# Patient Record
Sex: Male | Born: 1935 | Race: Black or African American | Hispanic: No | Marital: Married | Smoking: Former smoker
Health system: Southern US, Community
[De-identification: ages and names within clinical notes are randomized; demographics above are authoritative.]

## PROBLEM LIST (undated history)

## (undated) DIAGNOSIS — I10 Essential (primary) hypertension: Secondary | ICD-10-CM

## (undated) DIAGNOSIS — N400 Enlarged prostate without lower urinary tract symptoms: Secondary | ICD-10-CM

## (undated) DIAGNOSIS — E785 Hyperlipidemia, unspecified: Secondary | ICD-10-CM

## (undated) DIAGNOSIS — K5790 Diverticulosis of intestine, part unspecified, without perforation or abscess without bleeding: Secondary | ICD-10-CM

## (undated) DIAGNOSIS — T783XXA Angioneurotic edema, initial encounter: Secondary | ICD-10-CM

## (undated) HISTORY — PX: JOINT REPLACEMENT: SHX530

## (undated) HISTORY — PX: HERNIA REPAIR: SHX51

---

## 2004-08-05 ENCOUNTER — Ambulatory Visit: Payer: Self-pay | Admitting: General Surgery

## 2009-02-14 ENCOUNTER — Ambulatory Visit: Payer: Self-pay | Admitting: Gastroenterology

## 2011-05-12 ENCOUNTER — Ambulatory Visit: Payer: Self-pay | Admitting: Orthopedic Surgery

## 2011-06-23 ENCOUNTER — Ambulatory Visit: Payer: Self-pay | Admitting: Orthopedic Surgery

## 2011-06-23 LAB — CBC
HGB: 13.3 g/dL (ref 13.0–18.0)
MCHC: 33.7 g/dL (ref 32.0–36.0)
Platelet: 179 10*3/uL (ref 150–440)
RDW: 14.7 % — ABNORMAL HIGH (ref 11.5–14.5)
WBC: 3.7 10*3/uL — ABNORMAL LOW (ref 3.8–10.6)

## 2011-06-23 LAB — PROTIME-INR: Prothrombin Time: 14.4 secs (ref 11.5–14.7)

## 2011-06-23 LAB — SEDIMENTATION RATE: Erythrocyte Sed Rate: 7 mm/hr (ref 0–20)

## 2011-07-08 ENCOUNTER — Inpatient Hospital Stay: Payer: Self-pay | Admitting: Orthopedic Surgery

## 2011-07-09 LAB — BASIC METABOLIC PANEL
Calcium, Total: 7.5 mg/dL — ABNORMAL LOW (ref 8.5–10.1)
Creatinine: 1.07 mg/dL (ref 0.60–1.30)
EGFR (African American): >60
EGFR (Non-African Amer.): >60
Glucose: 107 mg/dL — ABNORMAL HIGH (ref 65–99)
Osmolality: 276 (ref 275–301)
Potassium: 3.7 mmol/L (ref 3.5–5.1)
Sodium: 138 mmol/L (ref 136–145)

## 2011-07-09 LAB — HEMOGLOBIN: HGB: 11.6 g/dL — ABNORMAL LOW (ref 13.0–18.0)

## 2011-07-09 LAB — PLATELET COUNT: Platelet: 165 10*3/uL (ref 150–440)

## 2011-07-14 LAB — PATHOLOGY REPORT

## 2011-11-12 ENCOUNTER — Ambulatory Visit: Payer: Self-pay | Admitting: Orthopedic Surgery

## 2011-11-12 DIAGNOSIS — I10 Essential (primary) hypertension: Secondary | ICD-10-CM

## 2011-11-12 LAB — PROTIME-INR
INR: 1.1
Prothrombin Time: 14.1 secs (ref 11.5–14.7)

## 2011-11-12 LAB — CBC
HGB: 13.3 g/dL (ref 13.0–18.0)
MCH: 30.5 pg (ref 26.0–34.0)
MCHC: 34.8 g/dL (ref 32.0–36.0)
Platelet: 224 10*3/uL (ref 150–440)
RBC: 4.38 10*6/uL — ABNORMAL LOW (ref 4.40–5.90)
RDW: 14.7 % — ABNORMAL HIGH (ref 11.5–14.5)
WBC: 2.8 10*3/uL — ABNORMAL LOW (ref 3.8–10.6)

## 2011-11-12 LAB — BASIC METABOLIC PANEL
Calcium, Total: 8.7 mg/dL (ref 8.5–10.1)
Chloride: 101 mmol/L (ref 98–107)
Co2: 32 mmol/L (ref 21–32)
Creatinine: 0.96 mg/dL (ref 0.60–1.30)
EGFR (African American): >60
EGFR (Non-African Amer.): >60
Potassium: 3.7 mmol/L (ref 3.5–5.1)
Sodium: 139 mmol/L (ref 136–145)

## 2011-11-12 LAB — APTT: Activated PTT: 30.6 secs (ref 23.6–35.9)

## 2011-11-12 LAB — SEDIMENTATION RATE: Erythrocyte Sed Rate: 8 mm/hr (ref 0–20)

## 2011-11-25 ENCOUNTER — Inpatient Hospital Stay: Payer: Self-pay | Admitting: Orthopedic Surgery

## 2011-11-26 LAB — BASIC METABOLIC PANEL
Anion Gap: 5 — ABNORMAL LOW (ref 7–16)
BUN: 13 mg/dL (ref 7–18)
Chloride: 104 mmol/L (ref 98–107)
Creatinine: 0.81 mg/dL (ref 0.60–1.30)
EGFR (Non-African Amer.): >60
Glucose: 91 mg/dL (ref 65–99)
Osmolality: 272 (ref 275–301)

## 2011-11-27 LAB — PATHOLOGY REPORT

## 2011-12-01 ENCOUNTER — Encounter: Payer: Self-pay | Admitting: Internal Medicine

## 2011-12-07 ENCOUNTER — Encounter: Payer: Self-pay | Admitting: Internal Medicine

## 2012-11-09 ENCOUNTER — Inpatient Hospital Stay: Payer: Self-pay | Admitting: Internal Medicine

## 2012-11-09 LAB — COMPREHENSIVE METABOLIC PANEL
Anion Gap: 3 — ABNORMAL LOW (ref 7–16)
BUN: 20 mg/dL — ABNORMAL HIGH (ref 7–18)
Bilirubin,Total: 0.4 mg/dL (ref 0.2–1.0)
Calcium, Total: 9 mg/dL (ref 8.5–10.1)
Chloride: 105 mmol/L (ref 98–107)
Co2: 30 mmol/L (ref 21–32)
Creatinine: 1.06 mg/dL (ref 0.60–1.30)
EGFR (African American): >60
Glucose: 138 mg/dL — ABNORMAL HIGH (ref 65–99)
Osmolality: 280 (ref 275–301)
Potassium: 3.4 mmol/L — ABNORMAL LOW (ref 3.5–5.1)
SGOT(AST): 26 U/L (ref 15–37)
SGPT (ALT): 21 U/L (ref 12–78)
Total Protein: 7.3 g/dL (ref 6.4–8.2)

## 2012-11-09 LAB — URINALYSIS, COMPLETE
Ketone: NEGATIVE
Protein: NEGATIVE
RBC,UR: 2 /HPF (ref 0–5)
Squamous Epithelial: 1
WBC UR: 3 /HPF (ref 0–5)

## 2012-11-09 LAB — LIPID PANEL
Cholesterol: 98 mg/dL (ref 0–200)
HDL Cholesterol: 29 mg/dL — ABNORMAL LOW (ref 40–60)
Ldl Cholesterol, Calc: 55 mg/dL (ref 0–100)
Triglycerides: 68 mg/dL (ref 0–200)
VLDL Cholesterol, Calc: 14 mg/dL (ref 5–40)

## 2012-11-09 LAB — CBC
HGB: 13.9 g/dL (ref 13.0–18.0)
MCHC: 35 g/dL (ref 32.0–36.0)
MCV: 88 fL (ref 80–100)
Platelet: 195 10*3/uL (ref 150–440)
RBC: 4.53 10*6/uL (ref 4.40–5.90)
WBC: 3.9 10*3/uL (ref 3.8–10.6)

## 2012-11-09 LAB — CK TOTAL AND CKMB (NOT AT ARMC)
CK, Total: 214 U/L (ref 35–232)
CK-MB: 4.7 ng/mL — ABNORMAL HIGH (ref 0.5–3.6)

## 2012-11-10 LAB — BASIC METABOLIC PANEL
Anion Gap: 3 — ABNORMAL LOW (ref 7–16)
Chloride: 104 mmol/L (ref 98–107)
Co2: 30 mmol/L (ref 21–32)
Creatinine: 0.91 mg/dL (ref 0.60–1.30)
EGFR (African American): >60
EGFR (Non-African Amer.): >60
Glucose: 102 mg/dL — ABNORMAL HIGH (ref 65–99)
Potassium: 3.7 mmol/L (ref 3.5–5.1)
Sodium: 137 mmol/L (ref 136–145)

## 2012-11-10 LAB — CBC WITH DIFFERENTIAL/PLATELET
Basophil #: 0 10*3/uL (ref 0.0–0.1)
HGB: 13.9 g/dL (ref 13.0–18.0)
Lymphocyte #: 0.7 10*3/uL — ABNORMAL LOW (ref 1.0–3.6)
Lymphocyte %: 14.4 %
MCH: 30.8 pg (ref 26.0–34.0)
MCHC: 35.3 g/dL (ref 32.0–36.0)
MCV: 87 fL (ref 80–100)
Monocyte #: 0.3 x10 3/mm (ref 0.2–1.0)
Neutrophil %: 78.4 %
Platelet: 194 10*3/uL (ref 150–440)
RDW: 14.4 % (ref 11.5–14.5)

## 2012-11-10 LAB — CK TOTAL AND CKMB (NOT AT ARMC)
CK, Total: 161 U/L (ref 35–232)
CK, Total: 146 U/L (ref 35–232)
CK-MB: 3.5 ng/mL (ref 0.5–3.6)
CK-MB: 3.3 ng/mL (ref 0.5–3.6)

## 2012-11-10 LAB — TROPONIN I: Troponin-I: <0.02 ng/mL

## 2013-02-21 ENCOUNTER — Observation Stay: Payer: Self-pay | Admitting: Student

## 2013-02-21 LAB — BASIC METABOLIC PANEL
ANION GAP: 5 — AB (ref 7–16)
BUN: 14 mg/dL (ref 7–18)
CO2: 29 mmol/L (ref 21–32)
Calcium, Total: 8.1 mg/dL — ABNORMAL LOW (ref 8.5–10.1)
Chloride: 103 mmol/L (ref 98–107)
Creatinine: 0.93 mg/dL (ref 0.60–1.30)
EGFR (African American): >60
EGFR (Non-African Amer.): >60
Glucose: 116 mg/dL — ABNORMAL HIGH (ref 65–99)
OSMOLALITY: 275 (ref 275–301)
POTASSIUM: 3.5 mmol/L (ref 3.5–5.1)
Sodium: 137 mmol/L (ref 136–145)

## 2013-02-21 LAB — APTT: Activated PTT: 25.5 secs (ref 23.6–35.9)

## 2013-02-21 LAB — CBC WITH DIFFERENTIAL/PLATELET
BASOS ABS: 0 10*3/uL (ref 0.0–0.1)
Basophil %: 0.8 %
Eosinophil #: 0.1 10*3/uL (ref 0.0–0.7)
Eosinophil %: 3.6 %
HCT: 40.1 % (ref 40.0–52.0)
HGB: 13.2 g/dL (ref 13.0–18.0)
Lymphocyte #: 1.3 10*3/uL (ref 1.0–3.6)
Lymphocyte %: 32.1 %
MCH: 29.4 pg (ref 26.0–34.0)
MCHC: 32.8 g/dL (ref 32.0–36.0)
MCV: 90 fL (ref 80–100)
MONO ABS: 0.3 x10 3/mm (ref 0.2–1.0)
MONOS PCT: 7.6 %
NEUTROS ABS: 2.3 10*3/uL (ref 1.4–6.5)
Neutrophil %: 55.9 %
Platelet: 210 10*3/uL (ref 150–440)
RBC: 4.47 10*6/uL (ref 4.40–5.90)
RDW: 14.6 % — ABNORMAL HIGH (ref 11.5–14.5)
WBC: 4.2 10*3/uL (ref 3.8–10.6)

## 2013-02-21 LAB — URINALYSIS, COMPLETE
Bilirubin,UR: NEGATIVE
Blood: NEGATIVE
Glucose,UR: NEGATIVE mg/dL (ref 0–75)
Ketone: NEGATIVE
NITRITE: NEGATIVE
PROTEIN: NEGATIVE
Ph: 5 (ref 4.5–8.0)
RBC,UR: 2 /HPF (ref 0–5)
SPECIFIC GRAVITY: 1.018 (ref 1.003–1.030)
Squamous Epithelial: 1

## 2013-02-21 LAB — TROPONIN I: Troponin-I: <0.02 ng/mL

## 2013-02-21 LAB — PROTIME-INR
INR: 1
PROTHROMBIN TIME: 13.4 s (ref 11.5–14.7)

## 2013-02-22 LAB — BASIC METABOLIC PANEL
Anion Gap: 4 — ABNORMAL LOW (ref 7–16)
BUN: 14 mg/dL (ref 7–18)
CALCIUM: 8.9 mg/dL (ref 8.5–10.1)
Chloride: 104 mmol/L (ref 98–107)
Co2: 28 mmol/L (ref 21–32)
Creatinine: 0.88 mg/dL (ref 0.60–1.30)
EGFR (African American): >60
GLUCOSE: 100 mg/dL — AB (ref 65–99)
OSMOLALITY: 273 (ref 275–301)
POTASSIUM: 3.5 mmol/L (ref 3.5–5.1)
Sodium: 136 mmol/L (ref 136–145)

## 2013-02-22 LAB — LIPID PANEL
CHOLESTEROL: 114 mg/dL (ref 0–200)
HDL: 29 mg/dL — AB (ref 40–60)
LDL CHOLESTEROL, CALC: 73 mg/dL (ref 0–100)
Triglycerides: 61 mg/dL (ref 0–200)
VLDL Cholesterol, Calc: 12 mg/dL (ref 5–40)

## 2013-02-22 LAB — CBC WITH DIFFERENTIAL/PLATELET
BASOS PCT: 0.5 %
Basophil #: 0 10*3/uL (ref 0.0–0.1)
EOS ABS: 0.1 10*3/uL (ref 0.0–0.7)
Eosinophil %: 2.4 %
HCT: 39.2 % — ABNORMAL LOW (ref 40.0–52.0)
HGB: 13.4 g/dL (ref 13.0–18.0)
LYMPHS ABS: 1 10*3/uL (ref 1.0–3.6)
Lymphocyte %: 18.2 %
MCH: 30.6 pg (ref 26.0–34.0)
MCHC: 34.2 g/dL (ref 32.0–36.0)
MCV: 89 fL (ref 80–100)
Monocyte #: 0.3 x10 3/mm (ref 0.2–1.0)
Monocyte %: 5.6 %
Neutrophil #: 4 10*3/uL (ref 1.4–6.5)
Neutrophil %: 73.3 %
Platelet: 204 10*3/uL (ref 150–440)
RBC: 4.39 10*6/uL — ABNORMAL LOW (ref 4.40–5.90)
RDW: 14.6 % — ABNORMAL HIGH (ref 11.5–14.5)
WBC: 5.5 10*3/uL (ref 3.8–10.6)

## 2013-02-22 LAB — TSH: Thyroid Stimulating Horm: 0.806 u[IU]/mL

## 2013-02-22 LAB — MAGNESIUM: Magnesium: 1.7 mg/dL — ABNORMAL LOW

## 2013-02-22 LAB — HEMOGLOBIN A1C: Hemoglobin A1C: 6 % (ref 4.2–6.3)

## 2013-02-23 LAB — URINE CULTURE

## 2013-08-22 IMAGING — CR DG KNEE 1-2V*R*
1 series · 2 of 2 positions shown · non-contrast
Comparison: none

REASON FOR EXAM: total right knee
COMMENTS:

PROCEDURE:     DXR - DXR KNEE RIGHT AP AND LATERAL  - November 25, 2011  [DATE]
RESULT:     Comparison: None.

[Series 1: ap · 0.17mm/px · 2 of 2 slices shown]
[im 1/2]
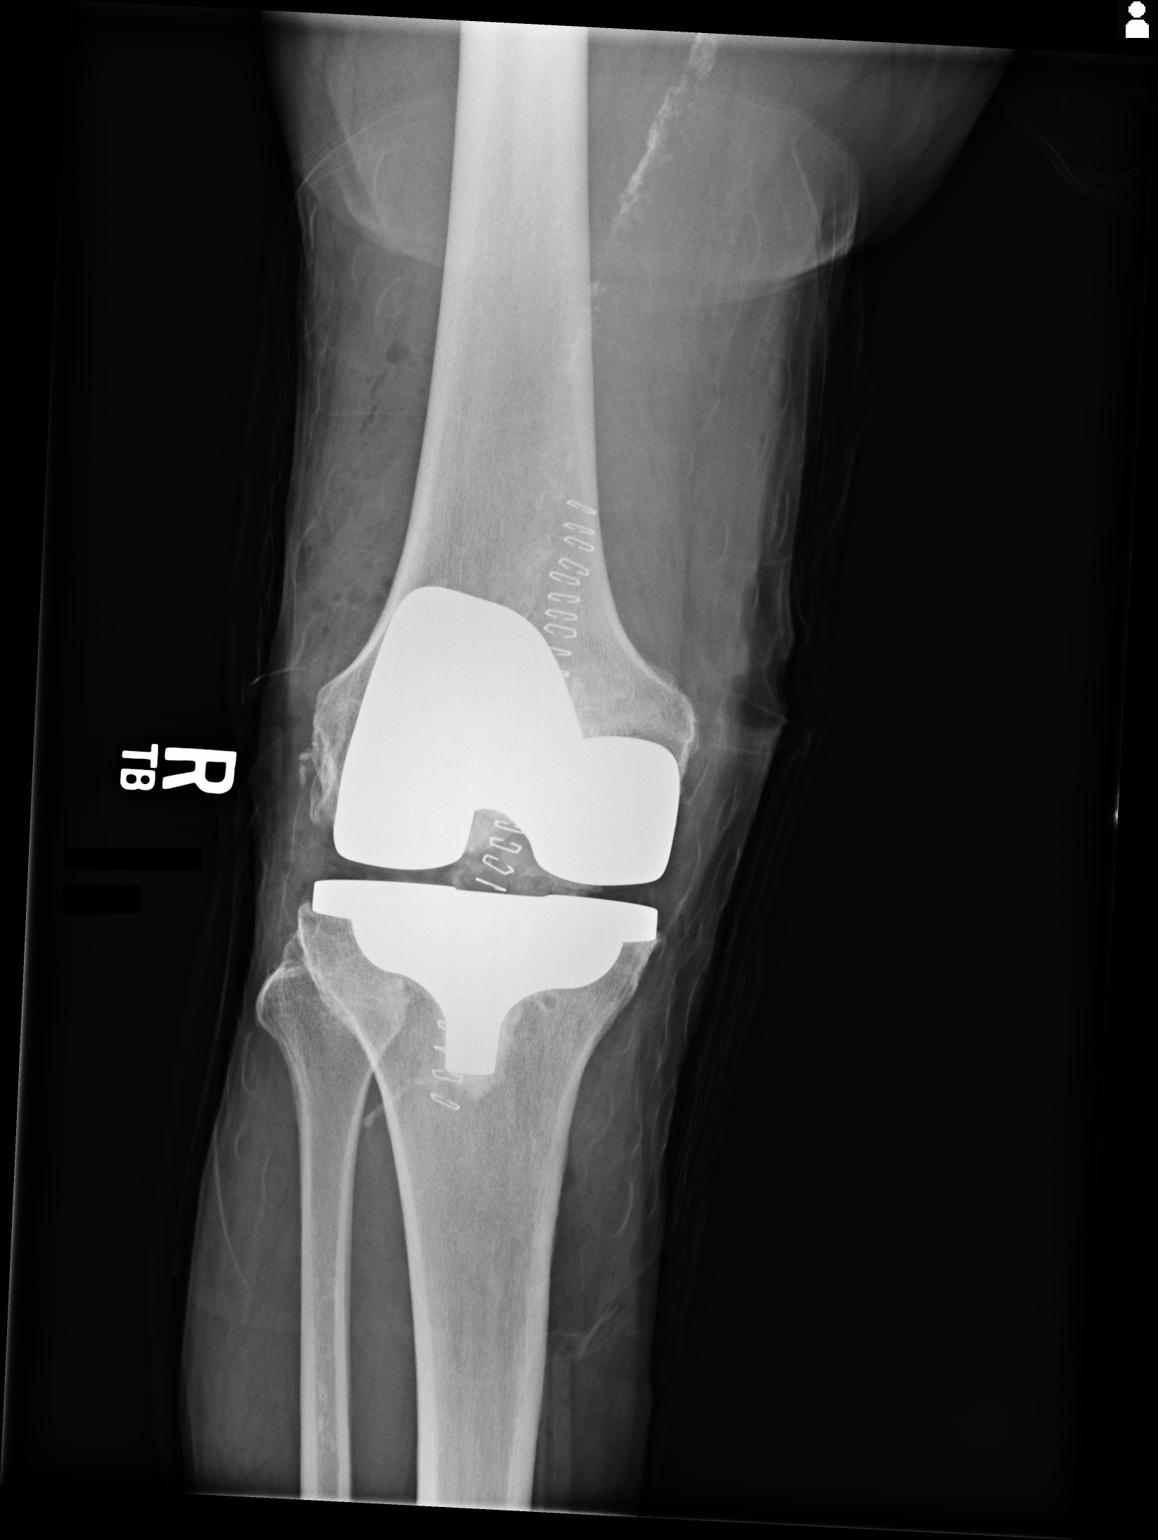
[im 2/2]
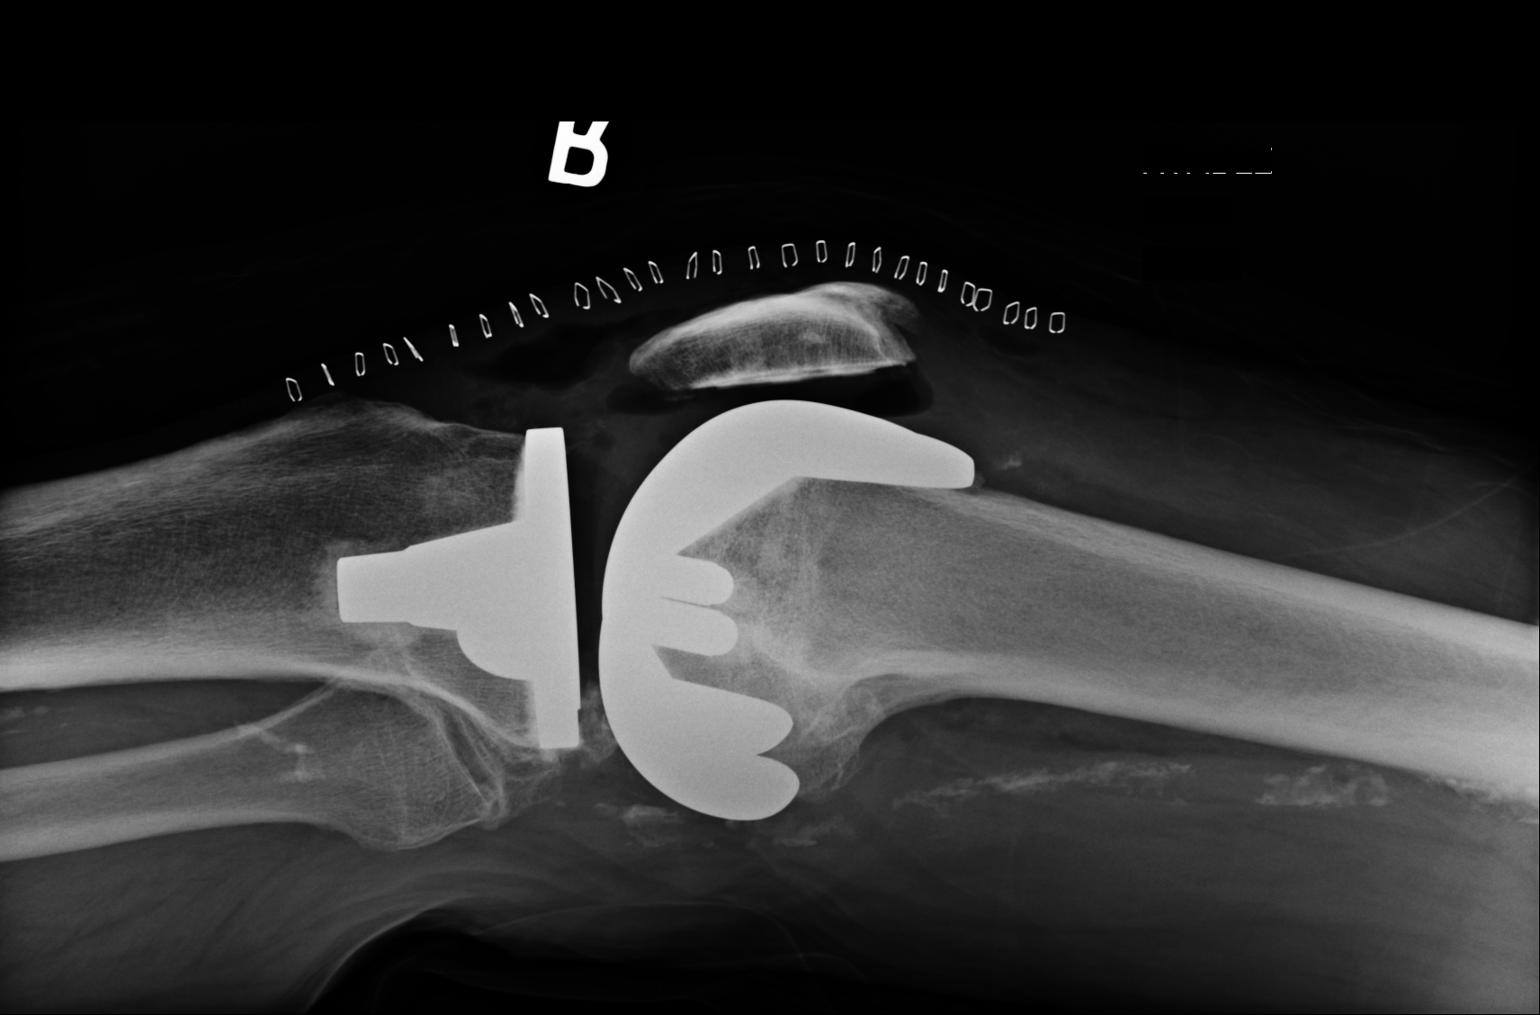

[2 of 2 positions shown; findings below may reference images not displayed]

FINDINGS: Hardware seen from right total knee arthroplasty. Skin staples are present.
Soft tissue gas is likely postoperative. Vascular calcifications are present.
IMPRESSION: Right total knee arthroplasty.

[REDACTED]

## 2014-02-01 DIAGNOSIS — Z800 Family history of malignant neoplasm of digestive organs: Secondary | ICD-10-CM | POA: Diagnosis not present

## 2014-02-17 ENCOUNTER — Ambulatory Visit: Payer: Self-pay | Admitting: Gastroenterology

## 2014-02-17 DIAGNOSIS — K5730 Diverticulosis of large intestine without perforation or abscess without bleeding: Secondary | ICD-10-CM | POA: Diagnosis not present

## 2014-02-17 DIAGNOSIS — I10 Essential (primary) hypertension: Secondary | ICD-10-CM | POA: Diagnosis not present

## 2014-02-17 DIAGNOSIS — Z1211 Encounter for screening for malignant neoplasm of colon: Secondary | ICD-10-CM | POA: Diagnosis not present

## 2014-02-17 DIAGNOSIS — Z800 Family history of malignant neoplasm of digestive organs: Secondary | ICD-10-CM | POA: Diagnosis not present

## 2014-03-20 DIAGNOSIS — I10 Essential (primary) hypertension: Secondary | ICD-10-CM | POA: Diagnosis not present

## 2014-03-20 DIAGNOSIS — E119 Type 2 diabetes mellitus without complications: Secondary | ICD-10-CM | POA: Diagnosis not present

## 2014-04-25 NOTE — Op Note (Signed)
PATIENT NAME:  Brett Johnston, Gerik J MR#:  [id number] DATE OF BIRTH:  [birth year]  DATE OF PROCEDURE:  11/25/2011  PREOPERATIVE DIAGNOSIS: Severe right knee osteoarthritis.   POSTOPERATIVE DIAGNOSIS: Severe right knee osteoarthritis.   PROCEDURE: Right total knee replacement.   SURGEON: Leitha SchullerMichael J. Waunita Sandstrom, MD   ASSISTANT: April Berndt, nurse practitioner  ANESTHESIA: Spinal.   DESCRIPTION OF PROCEDURE: The patient was brought to the operating room and after adequate anesthesia was obtained the right leg was prepped and draped in the usual sterile fashion with a tourniquet applied to the upper thigh. An Alvarado legholder was used during the procedure. After prepping and draping in the usual sterile manner, appropriate patient identification and time-out procedures were completed. The leg was exsanguinated with an Esmarch and the tourniquet raised to 300 mmHg at the start of the case. A midline skin incision was made followed by a medial parapatellar arthrotomy. Inspection of the knee revealed significant bone erosion on the medial compartment with extensive osteophyte formation. The patellofemoral joint also had extensive degenerative change with essentially no articular cartilage remaining. The lateral compartment had moderate degenerative change. The ACL and infrapatellar fat pad were excised. After exposing the proximal tibia, the proximal tibia cutting guide was placed and alignment checked. Proximal tibia cut was carried out and it matched the preop template. The medial and lateral menisci were excised at this time. The femoral cutting guide was then applied, drill holes made, and the distal cut made and again cuts matched the preop template. A #5 cutting block was applied and anterior, posterior, and chamfer cuts carried out. Trial components were placed at this time with a 5 tibia and a 10 mm insert. There is good stability in mid flexion and extension and at this point the distal femoral drill holes and  notch for the trochlear groove were made. The patella was cut with the guide and sized to a size 4. Drill holes were made for this. At this point the bony surfaces were thoroughly irrigated and dried with the posterior capsule infiltrated with 0.25% Sensorcaine with epinephrine and morphine in the posterior capsule. The bony surfaces were thoroughly irrigated and dried. The tibial component was cemented into place first followed by the 10 mm standard poly insert. Femoral component was then cemented into place and excess cement removed. The knee was held in extension as the patellar button was clamped into place again using cement, antibiotic cement being utilized. After the cement had set, patella was noted to track very well. Tourniquet was let down and hemostasis checked with electrocautery. The arthrotomy was repaired using a heavy quill suture, 2-0 quill subcutaneously, and skin staples. Xeroform, 4 x 4's, ABD, Webril, Polar Care, Ace wrap, and immobilizer were applied. The patient was sent to the recovery room in stable condition.   ESTIMATED BLOOD LOSS: 50 mL.   COMPLICATIONS: None.   SPECIMEN: Cut ends of bone.   TOURNIQUET TIME: 79 minutes at 300 mmHg.   ____________________________ Leitha SchullerMichael J. Taresa Montville, MD mjm:drc D: 11/25/2011 22:45:47 ET T: 11/26/2011 11:22:08 ET JOB#: [id number]  cc: Leitha SchullerMichael J. Esmeralda Malay, MD, <Dictator> Leitha SchullerMICHAEL J Arlee Santosuosso MD ELECTRONICALLY SIGNED 11/26/2011 12:37

## 2014-04-25 NOTE — Discharge Summary (Signed)
PATIENT NAME:  Brett Johnston, Brett Johnston MR#:  [id number] DATE OF BIRTH:  Sep 21, 1930  DATE OF ADMISSION:  11/25/2011 DATE OF DISCHARGE:  12/01/2011  ADMITTING DIAGNOSIS: Right knee osteoarthritis.   DISCHARGE DIAGNOSIS: Right knee osteoarthritis.   OPERATION: On 11/25/2011 he had a right total knee arthroplasty.   SURGEON: Dr. Kennedy Bucker   ANESTHESIA: Spinal.   ESTIMATED BLOOD LOSS: 50 mL.  TOURNIQUET TIME: 79 minutes at 300 mmHg.  DRAINS: None.  COMPLICATIONS: None.  IMPLANTS USED: Medacta 5 tibial, 5 standard femur, 10 mm insert, 4 patella, Medacta GMK total knee.    NOTE: Patient was stabilized and brought to the recovery room and then brought down to the orthopedic floor where he was treated by physical therapy and for pain control.   HISTORY: Brett Johnston is a 81 year old African American male underwent successful left total knee on 07/08/2011. After that surgery his right knee began bothering him and he had limitations to activities daily living secondary to pain. He had cortisone and Synvisc injections in the right knee without relief.   PHYSICAL EXAM: GENERAL: Well-developed, well-nourished, male in no apparent distress. Normal affect. Normal gait. Mildly antalgic component right lower extremity. HEENT: Head is normocephalic, atraumatic Pupils are equal, round, and reactive to light and accommodation. Has bilateral hearing aids and upper and lower dentures. Heart regular rate and rhythm. No murmur. Normal apical pulse. Lungs clear to auscultation. No wheeze, rhonchi or crackles. Normal expansion bilateral chest walls. Right lower extremity range of motion -10 to 120. Varus deformity. Crepitus with range of motion. Tender to palpation over medial joint line. No effusion. No swelling, warmth, erythema. Neurovascularly intact right lower extremity.   HOSPITAL COURSE: After admission 11/25/2011 he underwent a right total knee arthroplasty without complication. On postoperative day one his  hemoglobin was 11.8, and platelets 169. On postoperative day three he had a temperature of 100.0 and was not tolerating physical therapy very well. Decision was made to keep him for a couple more days. Original plan was for him to go home with home health but considering his slow progress with PT decision was made to transfer him to rehab. On postoperative day four, 11/29/2011, he had a bowel movement and temperature of 100.2. He continued to have minimal progress with physical therapy. On 11/30/2011, postoperative day number five, temperature is 100.2 and was beginning to tolerate physical therapy better. On postoperative day six he has poor tolerance to exercise. Decision was made to transfer him to rehab today as he was stable.   CONDITION AT DISCHARGE: Stable.   DISPOSITION: Patient sent to rehab.   DISCHARGE INSTRUCTIONS: Patient will follow up with [facility] orthopedics in two weeks. He may be weight bearing as tolerated on the right lower extremity and do physical therapy at rehab. His diet is regular. TED hose bilaterally. Covaderm on the right knee can be changed daily and as needed.   DISCHARGE MEDICATIONS:  1. Acetaminophen extra strength 500 to 1000 mg p.o. q.4 hours p.r.n. pain or temperature greater than 100.4.  2. Allopurinol 100 mg p.o. daily.  3. Aluminum hydroxide with simethicone 400/400/40/5 mL suspension 30 mL p.o. q.6h. p.r.n. indigestion, heartburn.  4. Amlodipine 5 mg 1 tab p.o. daily.  5. Dulcolax suppository 10 mg rectal daily p.r.n. constipation.  6. Docusate/senna 50/8.6 mg 1 tab p.o. b.i.d.  7. HCTZ 25 mg 1 tab p.o. q.a.m.  8. Magnesium hydroxide 30 mL p.o. b.i.d. p.r.n. constipation.  9. Oxycodone 5 mg to 10 mg p.o. q.4 hours  p.r.n. pain.  10. Potassium chloride 10 mEq p.o. daily.  11. Simvastatin 40 mg p.o. at bedtime.  12. Tamsulosin 0.4 mg p.o. at bedtime.  13. Tramadol 50 mg p.o. q.6 hours p.r.n. pain.  14. Zolpidem 5 mg p.o. at bedtime p.r.n. insomnia.    15. Xarelto 10 mg p.o. q.a.m. x7 days.   ____________________________ Brett Johnston M. Haskel KhanBerndt, NP amb:cms D: 12/01/2011 08:06:09 ET T: 12/01/2011 08:32:49 ET JOB#: [id number]  cc: Brett Anastacio M. Haskel KhanBerndt, NP, <Dictator> Burt EkAPRIL M Isack Lavalley FNP ELECTRONICALLY SIGNED 12/02/2011 15:46

## 2014-04-28 NOTE — H&P (Signed)
PATIENT NAME:  Brett Johnston, Brett Johnston Brett#:  [id number] DATE OF BIRTH:  1930/05/21  DATE OF ADMISSION:  11/09/2012  PRIMARY CARE PHYSICIAN: Hisham B. Danne Harbor, MD  REFERRING PHYSICIAN: Sheran Fava. Fanny Bien, MD   CHIEF COMPLAINT: Dizziness.   HISTORY OF PRESENT ILLNESS: Brett Johnston is an 81 year old pleasant African American male with a past medical history of hypertension, borderline diabetes mellitus, and was doing well in his usual state of health until this afternoon. The patient was going to the school to pick up his grandchildren. As he was returning back, started to experience severe dizziness. Concerned with this, he stopped the car and called his wife. Wife brought him to the Emergency Department. The episode lasted for about 10 minutes. He denies having any weakness in any part of the body. Workup in the Emergency Department, the patient was found to have an acute stroke in the left caudate nucleus, which is a lacunar infarct. Again, at the time of my interview the patient again had some episode of dizziness with vomiting. He did not have any weakness in any part of the body. Denies having any chest pain, palpitations; did experience some headache. Other lab work is unremarkable.   PAST MEDICAL HISTORY: 1. Hypertension.  2. Hyperlipidemia.  3. Gout.  4. Borderline diabetes mellitus.  5. Bilateral inguinal hernia repair.  6. Bilateral knee replacements.   ALLERGIES: No known drug allergies.   MEDICATIONS:   1. Tamsulosin 0.4 mg 1 capsule once a day.  2. Simvastatin 20 mg once a day.  3. Klor-Con 10 mEq daily.  4. Hydrochlorothiazide 25 mg once a day.  5. Amlodipine 5 mg daily.  6. Allopurinol 300 mg once a day.   SOCIAL HISTORY: No history of smoking, drinking alcohol or using illicit drugs. Married, lives with his wife.   FAMILY HISTORY: Mother had diabetes mellitus, died of unknown cause. Both parents died in their 5s.   REVIEW OF SYSTEMS: CONSTITUTIONAL: Experiences generalized weakness;  otherwise, usually well active.   EYES:  No change in vision.  EARS, NOSE, THROAT: Uses hearing aid. No sore throat.  RESPIRATORY: No cough, shortness of breath.  CARDIOVASCULAR: No chest pain, palpitations.  GASTROINTESTINAL: Had an episode of vomiting. Denies having any abdominal pain.  GENITOURINARY: No dysuria or hematuria.   ENDOCRINE: No polyuria or polydipsia.  HEMATOLOGIC: No easy bruising or bleeding.  SKIN: No rash or lesions.  MUSCULOSKELETAL: No joint pains and aches.  History of osteoarthritis.  NEUROLOGIC: No weakness or numbness in any part of the body, experiences dizziness.    PHYSICAL EXAMINATION: GENERAL: This is a well-built, well-nourished, age-appropriate male, lying down in the bed, not in distress.  VITAL SIGNS: Temperature 98, pulse 62, blood pressure 137/76, respiratory rate of 18, oxygen saturation 99% on room air.  HEENT: Head, normocephalic, atraumatic. No scleral icterus. Conjunctivae normal. Pupils equal and react to light. Extraocular movements are intact. Mucous membranes moist. No pharyngeal erythema.  NECK: Supple. No lymphadenopathy. No JVD. No carotid bruit. No thyromegaly.  CHEST: No focal tenderness.  LUNGS: Bilaterally clear to auscultation.  HEART: S1, S2 regular. No murmurs are heard.  ABDOMEN: Bowel sounds present. Soft, nontender, nondistended. No hepatosplenomegaly.  EXTREMITIES: No pedal edema. Pulses 2+.  NEUROLOGIC: The patient is alert; oriented to place, person and time. Cranial nerves II through XII intact. Motor 5/5 in upper and lower extremities.   LABORATORY, DIAGNOSTIC AND RADIOLOGIC DATA: Urinalysis negative for nitrites and leukocyte esterase. Complete metabolic panel: Potassium 3.4. CBC: WBC of 3.9,  hemoglobin 13.9, platelet count of 195.   CT head without contrast showed area of decreased attenuation, suspicious for small acute infarct in the caudate nucleus on the left.    ASSESSMENT AND PLAN: Brett Johnston is an 81 year old male  who comes to the Emergency Department with dizziness and is found to have acute infarct in the left caudate area.  1. Cerebrovascular accident. Admit the patient to a monitored bed. Cycle cardiac enzymes x 3. We will obtain MRI of the brain. The patient is given aspirin 325 mg in the Emergency Department. Continue with statin. We will involve physical therapy in the morning. We will  also obtain echocardiogram and carotid Doppler as part of the stroke workup. The patient has no history of arrhythmias.   2. Hypertension, currently well controlled. Continue the amlodipine. Hold the hydrochlorothiazide for now.  3. Diabetes mellitus, borderline, is well controlled. We will keep the patient on Accu-Cheks.  4. Keep the patient on deep vein thrombosis prophylaxis with Lovenox.    TIME SPENT: 45 minutes   ____________________________ Susa GriffinsPadmaja Deklen Popelka, MD pv:cc D: 11/09/2012 21:10:54 ET T: 11/09/2012 21:52:41 ET JOB#: [id number]  cc: Susa GriffinsPadmaja Nasteho Glantz, MD, <Dictator> Brett B. Danne HarborWalker III, MD Clerance LavPADMAJA Acheron Sugg MD ELECTRONICALLY SIGNED 12/05/2012 0:29

## 2014-04-29 NOTE — H&P (Signed)
PATIENT NAME:  Brett Johnston, Brett Johnston MR#:  [id number] DATE OF BIRTH:  1930/04/01  DATE OF ADMISSION:  02/21/2013  REFERRING PHYSICIAN: Dr. Fanny BienQuale.   PRIMARY CARE PHYSICIAN[facility]   CHIEF COMPLAINT: Dizziness.  HISTORY OF PRESENT ILLNESS: The patient is a pleasant 81 year old African American male with a history of hypertension, gout, hyperlipidemia, borderline diabetes, who comes in for dizziness. The patient of note had a very similar presentation in November, after which he was admitted. At that time, he was doing exactly the same, where he was going to pick up his grandkids and had severe dizziness, was admitted to the hospital and had an MRI done, which did not show acute stroke, and was discharged after couple of days. This time around, he again states that he had gone to pick up his grandkids, and this was about 11:30, and developed sudden onset dizziness. This was more positional as he was moving his head around. He came into the hospital, and ER physician discussed the case with Dr. Sherryll BurgerShah, who recommended observation for evaluation for stroke. The dizziness is better. He also did have a bout of vomiting, and he states that a lot and afterwards he did vomit after he was dizzy. He has no weakness, numbness, and his dizziness has improved.   PAST MEDICAL HISTORY: Hypertension, hyperlipidemia, gout, borderline diabetes, bilateral inguinal hernia repair, bilateral knee replacement.   ALLERGIES: None.   SOCIAL HISTORY: No tobacco, alcohol, or drug use.   FAMILY HISTORY: Mom with diabetes.   OUTPATIENT MEDICATIONS: Aspirin 325 mg 1 tab daily, amlodipine 5 mg daily, allopurinol 300 mg daily, hydrochlorothiazide 25 mg daily, Klor-Con 10 mEq once a day, simvastatin 20 mg daily, tamsulosin 0.4 mg daily.  REVIEW OF SYSTEMS:    CONSTITUTIONAL: No fevers, chills. Very little global weakness, no focal weakness, and a light headache.  EYES: No blurry vision or double vision.   EARS, NOSE, THROAT: No tinnitus or hearing loss. No sore throat. The patient does use bilateral hearing aids.  RESPIRATORY: No cough, wheezing, shortness of breath.  CARDIOVASCULAR: No chest pain, palpitations, CHF, or swelling in the legs.  GASTROINTESTINAL: Positive for vomiting x 1. No nausea now. No abdominal pain. No  bloody stools or dark stools.  GENITOURINARY: Denies dysuria or hematuria.  HEMATOLOGIC AND LYMPHATIC: No anemia or easy bruising.  SKIN: No rashes.  MUSCULOSKELETAL: Denies arthritis or gout.  NEUROLOGIC: No focal weakness or numbness.  PSYCHIATRIC: No anxiety or depression.   PHYSICAL EXAMINATION: VITAL SIGNS: Temperature on arrival 97.5, pulse rate 63, respiratory rate 18, blood pressure initially 146/75, O2 sat 95% on room air.  GENERAL: The patient is a well-developed male lying in bed in no obvious distress, talking in full sentences.  HEENT: Normocephalic, atraumatic. Pupils are equal and reactive. Extraocular muscles intact. Bilateral hearing aids. Moist mucous membranes.  NECK: Supple. No thyroid tenderness. No cervical lymphadenopathy.  CARDIOVASCULAR: S1, S2, regular rhythm. No murmurs, rubs, or gallops.  LUNGS: Clear to auscultation without wheezing, rhonchi, or rales.  ABDOMEN: Soft, nontender.  EXTREMITIES: No pitting edema.  SKIN: No obvious rashes or lesions.  NEUROLOGIC: Cranial nerves II through XII grossly intact. Strength is 5 out of 5 in all extremities. Sensation is intact to light touch. The patient has a negative finger-to-nose touch and heel-to-shin sign and rapid alternative movements. Did not ambulate the patient.  PSYCHIATRIC: Awake, alert, oriented x 3.   LABORATORY, DIAGNOSTIC, AND RADIOLOGIC DATA: EKG shows sinus brady with PACs, no acute  ST elevations or depressions. White count of 4.3, hemoglobin 13.2, platelets 210. Troponin negative. Glucose 116, BUN 14, creatinine 0.93, sodium 137, potassium 3.5. INR and PT are within normal limits.  Urinalysis not suggestive of infection. CT head without contrast showing stable noncontrast CT appearance of the brain, no acute intracranial abnormalities. X-ray chest, 1 view: A 3.1 x 2.4 cm opacity behind the right heart, which appears somewhat masslike. Lungs elsewhere are clear. Aorta is mildly prominent.   ASSESSMENT AND PLAN: We have a pleasant 81 year old with hypertension, hyperlipidemia, borderline diabetes, who comes in with another episode of dizziness, as he had in November, with a negative CAT scan of the head. At this point, he has mild dizziness but is neurologically intact, has a negative CT of the head. I would bring him in with this dizziness for further evaluation, including obtaining an MRI. He had an ultrasound of the carotids as well as echocardiogram a few months ago, which I would not repeat now, but would also obtain a neurology consult and also physical therapy consult. This could possibly also be benign positional vertigo, and I would start meclizine at this point. Would do frequent neuro checks and see what MRI shows. Would hold the blood pressure medication in case this is a stroke, but I doubt it. He does have this mass in the lung, and I would obtain a CT of the chest for further evaluation of this. We would check a lipid profile, TSH, and also a hemoglobin A1c. Start the patient on heparin for deep vein thrombosis prophylaxis, Zofran for vomiting.   TOTAL TIME SPENT: 40 minutes.    ____________________________ Krystal Eaton, MD sa:jcm D: 02/21/2013 16:16:32 ET T: 02/21/2013 16:53:32 ET JOB#: [id number]  cc: Krystal Eaton, MD, <Dictator> Bertran B. Danne Harbor, MD Marcelle [facility] MD ELECTRONICALLY SIGNED 03/11/2013 13:02

## 2014-04-30 NOTE — Op Note (Signed)
PATIENT NAME:  Brett Johnston, Brett Johnston MR#:  [id number] DATE OF BIRTH:  [birth year]  DATE OF PROCEDURE:  07/08/2011  PREOPERATIVE DIAGNOSIS: Severe left knee osteoarthritis.   POSTOPERATIVE DIAGNOSIS: Severe left knee osteoarthritis.   PROCEDURE PERFORMED:  Left total knee replacement.   SURGEON: Kennedy Bucker, MD    ASSISTANT: Cranston Neighbor, PA-C  ANESTHESIA: Spinal.    DESCRIPTION OF PROCEDURE: The patient was brought to the Operating Room, and after adequate spinal anesthesia was obtained there was initial complication of a urethral stricture, and there was a delay in the start of the case until Dr. Achilles Dunk could take care of this with dilation, and the catheter was to be left in place for one week postoperatively. After this had been completed, the left leg was prepped and draped in the usual sterile fashion with a tourniquet applied to the upper thigh. An Alvarado legholder was utilized.   After appropriate patient identification and timeout procedures were completed, the leg was exsanguinated with an Esmarch and the tourniquet raised to 300 mmHg. A midline skin incision was made followed by a medial parapatellar arthrotomy. There was extensive bone loss of the medial tibial compartment and exposed bone and sclerotic bone in the medial femoral condyle. The lateral compartment had pitting of the cartilage, and there was extensive wear with loss of all the articular cartilage on the patellar surface. The anterior cruciate ligament and fat pad were excised, and the proximal tibia was exposed with elevation of the medial capsule for placement of the Medacta cutting guide. When this was applied, the proximal tibial cut was carried out and the bone removed. Next, the femoral cut was made with an alignment guide applied after denuding some residual cartilage on the lateral femoral condyle. Distal drill holes were made as well and the distal femoral cut carried out and matched the bone block. The femur preoperatively  templated to a 5, and a 5 cutting block was applied. This gave appropriate cuts. Anterior, posterior, and chamfer cuts were carried out. At this point, the residual menisci were excised, and the posterior osteophyte off the medial femoral condyle was also removed with the use of a curved osteotome. After this was completed, the posterior cruciate ligament was released to allow for adequate mobilization of the tibia. Once this was done, the tibia could be brought forward and the tibial template applied, a #5 tibial baseplate. When this was in place, the pins were placed and the proximal drill hole made, followed by a V-cut with a 10 mm insert. With the #5 femur in place, the knee was brought out into extension and there was full range of motion. The patella tracked well at this point. The patella was cut with the cutting guide followed by additional freehand cut and drill holes made. It measured a 4 patella and was quite large. This also tracked well. At this point, the bony surfaces were thoroughly irrigated and dried, and local anesthetic as well as morphine and Toradol was infiltrated into the posterior capsule as well as the anterior capsule. The cement was mixed and the final components inserted and impacted with excess cement removed. After the cement had set, the knee was again thoroughly irrigated. There was good soft tissue balance, full extension and flexion with good tracking of the patella with no touch technique. The tourniquet was let down and hemostasis checked with electrocautery. The arthrotomy was repaired using a heavy quill for the capsule, 2-0 quill subcutaneously and skin staples. Xeroform, 4 x 4's, Webril,  and a Polar Care device were applied, along with a knee immobilizer; and the patient was sent to the recovery room in stable condition.   ESTIMATED BLOOD LOSS: 100 mL.   TOURNIQUET TIME: 59 minutes at 300 mmHg.   IMPLANTS: Medacta GMK Primary Total Knee System, left 5 standard femur, a  left fixed 5 tibia with a 10 mm UC insert, and a size 4 patella, all components cemented.   CONDITION TO RECOVERY ROOM: Stable.   SPECIMENS: Cut ends of bone.   ____________________________ Leitha SchullerMichael J. Gordon Carlson, MD mjm:cbb D: 07/08/2011 14:34:51 ET T: 07/08/2011 15:29:41 ET JOB#: [id number]  cc: Leitha SchullerMichael J. Amiyah Shryock, MD, <Dictator> Leitha SchullerMICHAEL J Aubrie Lucien MD ELECTRONICALLY SIGNED 07/08/2011 16:57

## 2014-04-30 NOTE — Consult Note (Signed)
Patient seen as an intraoperative consultation for catheter placement.  Assessment: Urethral stricture The patient underwent cystoscopy with urethral dilation and catheter placement.  There was a significant bulbar urethral stricture present.  He will need the Foley catheter for approximately one week.  [facility] with [facility].  Follow up can be arranged for catheter removal in the office.  He will need a subsequent cystoscopy in approximately 4 weeks for evaluation of stricture recurrence.  He may need more definitive treatment at some point in the near future.  Consider covering for more gram-negative bacteria with the manipulation.  A request was made for gentamicin 80 mg post procedure.  No other urological intervention is indicated at present.  Please contact us with any further questions.  Electronic Signatures: Smith Robertope, Maui Ahart S (MD)  (Signed on 02-Jul-13 12:08)  Authored  Last Updated: 02-Jul-13 12:08 by Smith Robertope, Lizandro Spellman S (MD)

## 2014-04-30 NOTE — Discharge Summary (Signed)
PATIENT NAME:  Brett Johnston, KIENER MR#:  [id number] DATE OF BIRTH:  1930-08-07  DATE OF ADMISSION:  07/08/2011 DATE OF DISCHARGE:  07/11/2011  ADMITTING DIAGNOSIS: Severe left knee osteoarthritis.   DISCHARGE DIAGNOSIS: Severe left knee osteoarthritis.  PROCEDURE PERFORMED: Left total knee replacement.   SURGEON: Kennedy Bucker, M.D.   ASSISTANT: Cranston Neighbor, PA-C.   ANESTHESIA: Spinal.   ESTIMATED BLOOD LOSS: 100 mL.  TOURNIQUET TIME: 59 minutes at 300 mmHg.  IMPLANTS: Medacta GMK primary total knee system, left five standard femur, a left fixed five tibia with a 10 mm, a UC insert and a size four patella. All components cemented.  CONDITION TO RECOVERY ROOM: Stable.   HISTORY: The patient is a 81 year old with two to three year history of severe left knee pain. The patient had pain in the medial and lateral compartments of the knee. He did not have any relief with anti-inflammatory medications as well as corticosteroid and Synvisc injections. He had been seen by Dr. Rosita Kea and had a MRI and knee CT which showed severe knee osteoarthritis in bilateral knees. The patient agreed to left knee first.   PHYSICAL EXAMINATION: GENERAL: Well developed, well nourished male in no apparent distress. Normal affect. Normal gait. No antalgic component.   HEENT: Head is normocephalic, atraumatic. Pupils equal, round, and reactive to light. Neck symmetric. The patient does have upper and lower dentures.   HEART: Examination of the heart reveals regular rate and rhythm. There is no murmur. There is normal apical pulse.   LUNGS: The lungs are clear to auscultation. There is no wheeze, rhonchi or crackles. There is normal expansion of bilateral chest walls.  LEFT LOWER EXTREMITY: Examination of left lower extremity shows no effusion. No erythema. No warmth. No bony abnormalities noted. There is a slight varus abnormality. The patient is tender along the medial and lateral joint lines. There is a small  posterior joint effusion. The patient has full extension and full flexion. There is tenderness and crepitus with flexion and extension. The patient has a negative McMurray test. There is no retropatellar discomfort. The patient has a negative talar stress test. There is no laxity of the knee.   NEUROLOGIC: There is good quad control. There is no known atrophy. The patient has a negative straight leg raise, and the patient has normal sensation to light touch.   VASCULAR: The patient has less than 2 second capillary refill. Dorsalis pedis and posterior tibial pulses are intact.   HOSPITAL COURSE: The patient was admitted to the hospital on 07/08/2011 for left total knee replacement. The patient had surgery that same day and was brought to the orthopedic floor from the PAC-U. The patient's Foley remained intact, and the patient was seen by Dr. Achilles Dunk for urethral stricture. The patient is to follow-up with Dr. Achilles Dunk in one week for Foley removal.  Throughout the patient's three-day stay, the patient progressed well with physical therapy. He had a bowel movement. Vital signs remained stable as well as hemoglobin.  On 07/11/2011, the patient was stable and ready for discharge to go home with home health and physical therapy.   CONDITION AT DISCHARGE: Stable.   DISCHARGE INSTRUCTIONS:  1. The patient may gradually increase weight-bearing on the affected extremity. 2. May elevate the affected foot/or leg on 1 or 2 pillows with the foot higher than the knee. 3. The knee-high TED hose on both legs and remove at bedtime. He needs to replace TED hose on the next morning. 4. The patient may  resume a regular diet. 5. He is to start Xarelto, pain meds oxycodone 5 to 10 mg every four hours as needed for pain. 6. He is to continue the Polar Care unit maintaining temperature between 40 and 50 degrees.  7. Keep the dressing clean and dry. 8. He is to call [facility] if the dressing gets water under  it. He should also [facility] if fever over 101.5, redness, swelling or drainage from the incision site. 9. He is referred with home physical therapy and home health.  FOLLOW-UP APPOINTMENT: Call [facility] and schedule appointment for two weeks.   DISCHARGE MEDICATIONS: 1. Amlodipine 5 mg oral tablet 1 tablet orally once a day in the morning. 2. Simvastatin 20 mg oral tablet 1 tablet orally once a day at bedtime. 3. Allopurinol 300 mg oral tablet 1 tablet orally once a day in the morning. 4. Tamsulosin 0.4 mg oral capsule one cap orally once a day in the morning. 5. Hydrochlorothiazide 25 mg oral tablet 1 tablet orally once a day in the morning. 6. Klor-Con 10 oral tablet, extended release 1 tablet orally once a day in the morning.     ____________________________ Evon Slackhomas C. Jini Horiuchi, PA-C tcg:kma D: 07/11/2011 07:57:08 ET T: 07/12/2011 14:32:45 ET JOB#: [id number]  cc: Evon Slackhomas C. Analaya Hoey, PA-C, <Dictator> Evon SlackHOMAS C Carolie Mcilrath GeorgiaPA ELECTRONICALLY SIGNED 07/17/2011 14:04

## 2014-04-30 NOTE — Op Note (Signed)
PATIENT NAME:  Brett Johnston, Brett Johnston MR#:  [id number] DATE OF BIRTH:  05-08-1930  DATE OF PROCEDURE:  07/08/2011  HISTORY: [name] is a 81 year old African American gentleman with no known prior urological history. He presented for a total knee replacement. Staff was unable to place a Foley catheter prior to the procedure. The patient was already under anesthesia at the time of consultation, so history-gathering was minimal. The decision was made to proceed with cystoscopy for further evaluation.   SURGEON: Edrick Oh, MD   DESCRIPTION OF PROCEDURE: The patient was then prepped and draped in the standard fashion. The 17-French flexible cystoscope was inserted through the urethra. Significant blood and clot was noted through the urethra in the bulbar region. A blind-ending area was encountered. No definitive opening was visible. A flexible tip Glidewire was utilized. Multiple manipulations were made with successful navigation through a very small opening. The guidewire was passed into the urinary bladder. Urethral dilators were then utilized to dilate sequentially from approximately 4-French to 18-French. Cystoscopy at 18-French demonstrated reasonable opening of the bulbar urethra with passage into the urinary bladder. The guidewire was repositioned through the cystoscope for confirmation of location. It appears that the strictured site was relatively thin and film-like. There was relative ease with dilation with only minimal resistance met with the first few dilations. An 18-French Councill catheter was then placed over the guidewire into the urinary bladder. Minimal resistance was met at the site of the stricture. The catheter was inflated and placed to gravity drainage. The patient was reprepped for the remainder of the procedure. He will need to keep the Foley catheter for approximately one week. He will need follow-up evaluation to evaluate for recurrence.  ____________________________ Denice Bors. Jacqlyn Larsen,  MD bsc:cbb D: 07/08/2011 11:53:50 ET T: 07/08/2011 13:11:11 ET JOB#: [id number]  cc: Denice Bors. Jacqlyn Larsen, MD, <Dictator> Laurene Footman, MD Denice Bors Jetty Berland MD ELECTRONICALLY SIGNED 07/08/2011 14:29

## 2014-06-23 DIAGNOSIS — I10 Essential (primary) hypertension: Secondary | ICD-10-CM | POA: Diagnosis not present

## 2014-06-23 DIAGNOSIS — E119 Type 2 diabetes mellitus without complications: Secondary | ICD-10-CM | POA: Diagnosis not present

## 2014-08-02 ENCOUNTER — Encounter: Payer: Self-pay | Admitting: Emergency Medicine

## 2014-08-02 ENCOUNTER — Emergency Department
Admission: EM | Admit: 2014-08-02 | Discharge: 2014-08-02 | Disposition: A | Discharge status: Home / Self Care | Payer: Commercial Managed Care - HMO | Attending: Emergency Medicine | Admitting: Emergency Medicine

## 2014-08-02 ENCOUNTER — Emergency Department: Payer: Commercial Managed Care - HMO

## 2014-08-02 DIAGNOSIS — Y9389 Activity, other specified: Secondary | ICD-10-CM | POA: Insufficient documentation

## 2014-08-02 DIAGNOSIS — Y9289 Other specified places as the place of occurrence of the external cause: Secondary | ICD-10-CM | POA: Insufficient documentation

## 2014-08-02 DIAGNOSIS — M19011 Primary osteoarthritis, right shoulder: Secondary | ICD-10-CM

## 2014-08-02 DIAGNOSIS — Y998 Other external cause status: Secondary | ICD-10-CM | POA: Diagnosis not present

## 2014-08-02 DIAGNOSIS — W1839XA Other fall on same level, initial encounter: Secondary | ICD-10-CM | POA: Diagnosis not present

## 2014-08-02 DIAGNOSIS — S4991XA Unspecified injury of right shoulder and upper arm, initial encounter: Secondary | ICD-10-CM | POA: Diagnosis not present

## 2014-08-02 DIAGNOSIS — M25511 Pain in right shoulder: Secondary | ICD-10-CM | POA: Diagnosis not present

## 2014-08-02 DIAGNOSIS — Z87891 Personal history of nicotine dependence: Secondary | ICD-10-CM | POA: Insufficient documentation

## 2014-08-02 DIAGNOSIS — S8992XA Unspecified injury of left lower leg, initial encounter: Secondary | ICD-10-CM | POA: Diagnosis present

## 2014-08-02 MED ORDER — MELOXICAM 7.5 MG PO TABS: 7.5000 mg | ORAL_TABLET | Freq: Every day | ORAL | Status: DC

## 2014-08-02 NOTE — ED Notes (Signed)
Pt states he slipped and fell Saturday night and landed on right shoulder, states he is now unable to lift shoulder above head

## 2014-08-02 NOTE — Discharge Instructions (Signed)
Arthritis, Nonspecific °Arthritis is pain, redness, warmth, or puffiness (inflammation) of a joint. The joint may be stiff or hurt when you move it. One or more joints may be affected. There are many types of arthritis. Your doctor may not know what type you have right away. The most common cause of arthritis is wear and tear on the joint (osteoarthritis). °HOME CARE  °· Only take medicine as told by your doctor. °· Rest the joint as much as possible. °· Raise (elevate) your joint if it is puffy. °· Use crutches if the painful joint is in your leg. °· Drink enough fluids to keep your pee (urine) clear or pale yellow. °· Follow your doctor's diet instructions. °· Use cold packs for very bad joint pain for 10 to 15 minutes every hour. Ask your doctor if it is okay for you to use hot packs. °· Exercise as told by your doctor. °· Take a warm shower if you have stiffness in the morning. °· Move your sore joints throughout the day. °GET HELP RIGHT AWAY IF:  °· You have a fever. °· You have very bad joint pain, puffiness, or redness. °· You have many joints that are painful and puffy. °· You are not getting better with treatment. °· You have very bad back pain or leg weakness. °· You cannot control when you poop (bowel movement) or pee (urinate). °· You do not feel better in 24 hours or are getting worse. °· You are having side effects from your medicine. °MAKE SURE YOU:  °· Understand these instructions. °· Will watch your condition. °· Will get help right away if you are not doing well or get worse. °Document Released: 03/19/2009 Document Revised: 06/24/2011 Document Reviewed: 03/19/2009 °Exit[employer]® Patient Information ©2015 [employer]. This information is not intended to replace advice given to you by your health care provider. Make sure you discuss any questions you have with your health care provider. ° °

## 2014-08-02 NOTE — ED Provider Notes (Signed)
[facility] Emergency Department Provider Note  ____________________________________________  Time seen: Approximately 5:35 PM  I have reviewed the triage vital signs and the nursing notes.   HISTORY  Chief Complaint Shoulder Pain    HPI Brett Johnston is a 81 y.o. male patient complaining of decreased range of motion especially over his left is secondary to a slip and fall landing on his right shoulder 5 days ago. Patient state this causing no pain elicited temp to abduct the arm or each over his head. Denies any loss of sensation or loss of strength of the right upper extremity. No palliative measures except for decreased his range of motion to avoid pain. Patient state there is a sharp pain when he tried to reach above his head.  History reviewed. No pertinent past medical history.  There are no active problems to display for this patient.   Past Surgical History  Procedure Laterality Date  . Joint replacement      Current Outpatient Rx  Name  Route  Sig  Dispense  Refill  . meloxicam (MOBIC) 7.5 MG tablet   Oral   Take 1 tablet (7.5 mg total) by mouth daily.   15 tablet   0     Allergies Review of patient's allergies indicates no known allergies.  No family history on file.  Social History History  Substance Use Topics  . Smoking status: Former Games developer  . Smokeless tobacco: Not on file  . Alcohol Use: No    Review of Systems Constitutional: No fever/chills Eyes: No visual changes. ENT: No sore throat. Cardiovascular: Denies chest pain. Respiratory: Denies shortness of breath. Gastrointestinal: No abdominal pain.  No nausea, no vomiting.  No diarrhea.  No constipation. Genitourinary: Negative for dysuria. Musculoskeletal: Decreased range of motion right upper extremity.  Skin: Negative for rash. Neurological: Negative for headaches, focal weakness or numbness. 10-point ROS otherwise  negative.  ____________________________________________   PHYSICAL EXAM:  VITAL SIGNS: ED Triage Vitals  Enc Vitals Group     BP 08/02/14 1700 113/74 mmHg     Pulse Rate 08/02/14 1700 65     Resp 08/02/14 1700 18     Temp 08/02/14 1700 98.1 F (36.7 C)     Temp Source 08/02/14 1700 Oral     SpO2 08/02/14 1700 96 %     Weight 08/02/14 1700 185 lb (83.915 kg)     Height 08/02/14 1700  (1.702 m)     Head Cir --      Peak Flow --      Pain Score 08/02/14 1701 0     Pain Loc --      Pain Edu? --      Excl. in GC? --     Constitutional: Alert and oriented. Well appearing and in no acute distress. Eyes: Conjunctivae are normal. PERRL. EOMI. Head: Atraumatic. Nose: No congestion/rhinnorhea. Mouth/Throat: Mucous membranes are moist.  Oropharynx non-erythematous. Neck: No stridor. No cervical spine tenderness to palpation. Hematological/Lymphatic/Immunilogical: No cervical lymphadenopathy. Cardiovascular: Normal rate, regular rhythm. Grossly normal heart sounds.  Good peripheral circulation. Respiratory: Normal respiratory effort.  No retractions. Lungs CTAB. Gastrointestinal: Soft and nontender. No distention. No abdominal bruits. No CVA tenderness. Musculoskeletal: No lower extremity tenderness nor edema.  No joint effusions. Neurologic:  Normal speech and language. No gross focal neurologic deficits are appreciated. No gait instability. Skin:  Skin is warm, dry and intact. No rash noted. Psychiatric: Mood and affect are normal. Speech and behavior are normal.  ____________________________________________   LABS (all labs ordered are listed, but only abnormal results are displayed)  Labs Reviewed - No data to display ____________________________________________  EKG   ____________________________________________  RADIOLOGY  No acute findings. Arthritis is noticed in the [facility] joint and also in the before meals joint.  I, Joni Reining, personally viewed and  evaluated these images as part of my medical decision making.   ____________________________________________   PROCEDURES  Procedure(s) performed: None  Critical Care performed: No  ____________________________________________   INITIAL IMPRESSION / ASSESSMENT AND PLAN / ED COURSE  Pertinent labs & imaging results that were available during my care of the patient were reviewed by me and considered in my medical decision making (see chart for details).  Impingement right shoulder secondary to arthritis. Patient advised on supportive home care and will be given a prescription for Motrin 7.5 mg by mouth daily for 10 days. Patient advised follow-up with family doctor if no improvement or return to ER if condition worsens. ____________________________________________   FINAL CLINICAL IMPRESSION(S) / ED DIAGNOSES  Final diagnoses:  Arthritis of right shoulder region       Joni Reining, PA-C 08/02/14 1827  Joni Reining, PA-C 08/02/14 1830  Phineas Semen, MD 08/02/14 [phone]

## 2014-08-02 NOTE — ED Notes (Signed)
Pt has right shoulder pain.  States he slipped on wet grass 5 days ago and landed on right shoulder.  Limited rom.  Denies pain.  Denies other injuries.  Pt alert.

## 2014-08-09 DIAGNOSIS — M25511 Pain in right shoulder: Secondary | ICD-10-CM | POA: Diagnosis not present

## 2014-09-13 ENCOUNTER — Encounter: Payer: Self-pay | Admitting: *Deleted

## 2014-09-13 DIAGNOSIS — Y9389 Activity, other specified: Secondary | ICD-10-CM | POA: Insufficient documentation

## 2014-09-13 DIAGNOSIS — S81012A Laceration without foreign body, left knee, initial encounter: Secondary | ICD-10-CM | POA: Insufficient documentation

## 2014-09-13 DIAGNOSIS — Y9289 Other specified places as the place of occurrence of the external cause: Secondary | ICD-10-CM | POA: Diagnosis not present

## 2014-09-13 DIAGNOSIS — Z23 Encounter for immunization: Secondary | ICD-10-CM | POA: Insufficient documentation

## 2014-09-13 DIAGNOSIS — W312XXA Contact with powered woodworking and forming machines, initial encounter: Secondary | ICD-10-CM | POA: Insufficient documentation

## 2014-09-13 DIAGNOSIS — Z791 Long term (current) use of non-steroidal anti-inflammatories (NSAID): Secondary | ICD-10-CM | POA: Insufficient documentation

## 2014-09-13 DIAGNOSIS — Z87891 Personal history of nicotine dependence: Secondary | ICD-10-CM | POA: Diagnosis not present

## 2014-09-13 DIAGNOSIS — Y998 Other external cause status: Secondary | ICD-10-CM | POA: Diagnosis not present

## 2014-09-13 DIAGNOSIS — S8992XA Unspecified injury of left lower leg, initial encounter: Secondary | ICD-10-CM | POA: Diagnosis present

## 2014-09-13 NOTE — ED Notes (Signed)
Pt has laceration to left knee.  Pt states cut self with a chainsaw tonight.  bleeding controlled

## 2014-09-14 ENCOUNTER — Emergency Department
Admission: EM | Admit: 2014-09-14 | Discharge: 2014-09-14 | Disposition: A | Discharge status: Home / Self Care | Payer: Commercial Managed Care - HMO | Attending: Emergency Medicine | Admitting: Emergency Medicine

## 2014-09-14 DIAGNOSIS — Z23 Encounter for immunization: Secondary | ICD-10-CM | POA: Diagnosis not present

## 2014-09-14 DIAGNOSIS — Z87891 Personal history of nicotine dependence: Secondary | ICD-10-CM | POA: Diagnosis not present

## 2014-09-14 DIAGNOSIS — Z791 Long term (current) use of non-steroidal anti-inflammatories (NSAID): Secondary | ICD-10-CM | POA: Diagnosis not present

## 2014-09-14 DIAGNOSIS — IMO0002 Reserved for concepts with insufficient information to code with codable children: Secondary | ICD-10-CM

## 2014-09-14 DIAGNOSIS — S81012A Laceration without foreign body, left knee, initial encounter: Secondary | ICD-10-CM | POA: Diagnosis not present

## 2014-09-14 MED ORDER — LIDOCAINE-EPINEPHRINE (PF) 2 %-1:200000 IJ SOLN
10.0000 mL | Freq: Once | INTRAMUSCULAR | Status: AC
  Administered 2014-09-14: 10 mL via INTRADERMAL

## 2014-09-14 MED ORDER — LIDOCAINE-EPINEPHRINE 1 %-1:100000 IJ SOLN: 10.0000 mL | Freq: Once | INTRAMUSCULAR | Status: DC

## 2014-09-14 MED ORDER — BACITRACIN ZINC 500 UNIT/GM EX OINT
TOPICAL_OINTMENT | Freq: Once | CUTANEOUS | Status: AC
  Administered 2014-09-14: 2 via TOPICAL
  Filled 2014-09-14: qty 1.8

## 2014-09-14 MED ORDER — TETANUS-DIPHTH-ACELL PERTUSSIS 5-2.5-18.5 LF-MCG/0.5 IM SUSP
0.5000 mL | Freq: Once | INTRAMUSCULAR | Status: AC
  Administered 2014-09-14: 0.5 mL via INTRAMUSCULAR
  Filled 2014-09-14: qty 0.5

## 2014-09-14 MED ORDER — LIDOCAINE-EPINEPHRINE (PF) 1 %-1:200000 IJ SOLN
INTRAMUSCULAR | Status: AC
  Filled 2014-09-14: qty 30

## 2014-09-14 NOTE — ED Provider Notes (Signed)
[facility] Emergency Department Provider Note    ____________________________________________  Time seen: 0315  I have reviewed the triage vital signs and the nursing notes.   HISTORY  Chief Complaint Laceration   History limited by: Not Limited   HPI Brett Johnston is a 81 y.o. male who presents to the emergency department today with concerns for left knee injury. Patient states that he was [place] some small [place] with a chain saw when it caught him on his left knee. He states that he did have some bleeding at that time. He denies any significant pain to the wound. Denies any other injuries. Unsure when his last tetanus shot was.     No past medical history on file.  There are no active problems to display for this patient.   Past Surgical History  Procedure Laterality Date  . Joint replacement      Current Outpatient Rx  Name  Route  Sig  Dispense  Refill  . meloxicam (MOBIC) 7.5 MG tablet   Oral   Take 1 tablet (7.5 mg total) by mouth daily.   15 tablet   0     Allergies Review of patient's allergies indicates no known allergies.  No family history on file.  Social History Social History  Substance Use Topics  . Smoking status: Former Games developer  . Smokeless tobacco: None  . Alcohol Use: No    Review of Systems  Constitutional: Negative for fever. Cardiovascular: Negative for chest pain. Respiratory: Negative for shortness of breath. Gastrointestinal: Negative for abdominal pain, vomiting and diarrhea. Genitourinary: Negative for dysuria. Musculoskeletal: Negative for back pain. Skin: Negative for rash. Laceration to left knee Neurological: Negative for headaches, focal weakness or numbness.  10-point ROS otherwise negative.  ____________________________________________   PHYSICAL EXAM:  VITAL SIGNS: ED Triage Vitals  Enc Vitals Group     BP 09/13/14 2242 135/73 mmHg     Pulse Rate 09/13/14 2242  85     Resp 09/13/14 2242 20     Temp 09/13/14 2242 97.5 F (36.4 C)     Temp Source 09/13/14 2242 Oral     SpO2 09/13/14 2242 99 %     Weight 09/13/14 2242 185 lb (83.915 kg)     Height 09/13/14 2242  (1.702 m)   Constitutional: Alert and oriented. Well appearing and in no distress. Eyes: Conjunctivae are normal. PERRL. Normal extraocular movements. ENT   Head: Normocephalic and atraumatic.   Nose: No congestion/rhinnorhea.   Mouth/Throat: Mucous membranes are moist.   Neck: No stridor. Hematological/Lymphatic/Immunilogical: No cervical lymphadenopathy. Cardiovascular: Normal rate, regular rhythm.  No murmurs, rubs, or gallops. Respiratory: Normal respiratory effort without tachypnea nor retractions. Breath sounds are clear and equal bilaterally. No wheezes/rales/rhonchi. Musculoskeletal: Normal range of motion in all extremities. No joint effusions.  No lower extremity tenderness nor edema. Neurologic:  Normal speech and language. No gross focal neurologic deficits are appreciated. Speech is normal.  Skin:  Skin is warm, dry. Patient has 2 linear lacerations to the left knee which equal roughly 5 cm combined. Superficial. No deep extension. Psychiatric: Mood and affect are normal. Speech and behavior are normal. Patient exhibits appropriate insight and judgment.  ____________________________________________    LABS (pertinent positives/negatives)  None  ____________________________________________   EKG  None  ____________________________________________    RADIOLOGY  None  ____________________________________________   PROCEDURES  Procedure(s) performed: Laceration Repair, see procedure note(s).  Critical Care performed: No  LACERATION REPAIR Performed by:  Phineas Semen Authorized by: Phineas Semen Consent: Verbal consent obtained. Risks and benefits: risks, benefits and alternatives were discussed Consent given by: patient Patient  identity confirmed: provided demographic data Prepped and Draped in normal sterile fashion Wound explored  Laceration Location: left knee  Laceration Length: 5 cm  No Foreign Bodies seen or palpated  Anesthesia: local infiltration  Local anesthetic: lidocaine 1% with epinephrine  Anesthetic total: 3 ml  Irrigation method: syringe Amount of cleaning: standard  Skin closure: 4-0 vicryl rapide  Number of sutures: 11  Technique: simple interrupted  Patient tolerance: Patient tolerated the procedure well with no immediate complications.  ____________________________________________   INITIAL IMPRESSION / ASSESSMENT AND PLAN / ED COURSE  Pertinent labs & imaging results that were available during my care of the patient were reviewed by me and considered in my medical decision making (see chart for details).  Patient presented to the emergency department today because of concerns for laceration to his left knee. On exam the laceration is roughly 5 cm in length however they are superficial. I do not see any evidence that the lacerations are deep or would violate the joint space. I did suture the wounds to get better approximation. Patient tolerated the procedure well.  ____________________________________________   FINAL CLINICAL IMPRESSION(S) / ED DIAGNOSES  Final diagnoses:  Laceration     Phineas Semen, MD 09/14/14 0400

## 2014-09-14 NOTE — Discharge Instructions (Signed)
Your stitches are dissolvable - they should go away on their own in 7-10 days. Please seek medical attention for any high fevers, chest pain, shortness of breath, change in behavior, persistent vomiting, bloody stool or any other new or concerning symptoms.   Laceration Care, Adult A laceration is a cut or lesion that goes through all layers of the skin and into the tissue just beneath the skin. TREATMENT  Some lacerations may not require closure. Some lacerations may not be able to be closed due to an increased risk of infection. It is important to see your caregiver as soon as possible after an injury to minimize the risk of infection and maximize the opportunity for successful closure. If closure is appropriate, pain medicines may be given, if needed. The wound will be cleaned to help prevent infection. Your caregiver will use stitches (sutures), staples, wound glue (adhesive), or skin adhesive strips to repair the laceration. These tools bring the skin edges together to allow for faster healing and a better cosmetic outcome. However, all wounds will heal with a scar. Once the wound has healed, scarring can be minimized by covering the wound with sunscreen during the day for 1 full year. HOME CARE INSTRUCTIONS  For sutures or staples:  Keep the wound clean and dry.  If you were given a bandage (dressing), you should change it at least once a day. Also, change the dressing if it becomes wet or dirty, or as directed by your caregiver.  Wash the wound with soap and water 2 times a day. Rinse the wound off with water to remove all soap. Pat the wound dry with a clean towel.  After cleaning, apply a thin layer of the antibiotic ointment as recommended by your caregiver. This will help prevent infection and keep the dressing from sticking.  You may shower as usual after the first 24 hours. Do not soak the wound in water until the sutures are removed.  Only take over-the-counter or prescription  medicines for pain, discomfort, or fever as directed by your caregiver.  Get your sutures or staples removed as directed by your caregiver. For skin adhesive strips:  Keep the wound clean and dry.  Do not get the skin adhesive strips wet. You may bathe carefully, using caution to keep the wound dry.  If the wound gets wet, pat it dry with a clean towel.  Skin adhesive strips will fall off on their own. You may trim the strips as the wound heals. Do not remove skin adhesive strips that are still stuck to the wound. They will fall off in time. For wound adhesive:  You may briefly wet your wound in the shower or bath. Do not soak or scrub the wound. Do not swim. Avoid periods of heavy perspiration until the skin adhesive has fallen off on its own. After showering or bathing, gently pat the wound dry with a clean towel.  Do not apply liquid medicine, cream medicine, or ointment medicine to your wound while the skin adhesive is in place. This may loosen the film before your wound is healed.  If a dressing is placed over the wound, be careful not to apply tape directly over the skin adhesive. This may cause the adhesive to be pulled off before the wound is healed.  Avoid prolonged exposure to sunlight or tanning lamps while the skin adhesive is in place. Exposure to ultraviolet light in the first year will darken the scar.  The skin adhesive will usually remain in  place for 5 to 10 days, then naturally fall off the skin. Do not pick at the adhesive film. You may need a tetanus shot if:  You cannot remember when you had your last tetanus shot.  You have never had a tetanus shot. If you get a tetanus shot, your arm may swell, get red, and feel warm to the touch. This is common and not a problem. If you need a tetanus shot and you choose not to have one, there is a rare chance of getting tetanus. Sickness from tetanus can be serious. SEEK MEDICAL CARE IF:   You have redness, swelling, or  increasing pain in the wound.  You see a red line that goes away from the wound.  You have yellowish-white fluid (pus) coming from the wound.  You have a fever.  You notice a bad smell coming from the wound or dressing.  Your wound breaks open before or after sutures have been removed.  You notice something coming out of the wound such as wood or glass.  Your wound is on your hand or foot and you cannot move a finger or toe. SEEK IMMEDIATE MEDICAL CARE IF:   Your pain is not controlled with prescribed medicine.  You have severe swelling around the wound causing pain and numbness or a change in color in your arm, hand, leg, or foot.  Your wound splits open and starts bleeding.  You have worsening numbness, weakness, or loss of function of any joint around or beyond the wound.  You develop painful lumps near the wound or on the skin anywhere on your body. MAKE SURE YOU:   Understand these instructions.  Will watch your condition.  Will get help right away if you are not doing well or get worse. Document Released: 12/23/2004 Document Revised: 03/17/2011 Document Reviewed: 06/18/2010 [facility] Patient Information 2015 [place], [place]. This information is not intended to replace advice given to you by your health care provider. Make sure you discuss any questions you have with your health care provider.

## 2014-09-25 DIAGNOSIS — E119 Type 2 diabetes mellitus without complications: Secondary | ICD-10-CM | POA: Diagnosis not present

## 2014-09-25 DIAGNOSIS — I10 Essential (primary) hypertension: Secondary | ICD-10-CM | POA: Diagnosis not present

## 2014-12-27 DIAGNOSIS — E119 Type 2 diabetes mellitus without complications: Secondary | ICD-10-CM | POA: Diagnosis not present

## 2014-12-27 DIAGNOSIS — I10 Essential (primary) hypertension: Secondary | ICD-10-CM | POA: Diagnosis not present

## 2015-03-30 DIAGNOSIS — E119 Type 2 diabetes mellitus without complications: Secondary | ICD-10-CM | POA: Diagnosis not present

## 2015-03-30 DIAGNOSIS — Z0001 Encounter for general adult medical examination with abnormal findings: Secondary | ICD-10-CM | POA: Diagnosis not present

## 2015-03-30 DIAGNOSIS — I10 Essential (primary) hypertension: Secondary | ICD-10-CM | POA: Diagnosis not present

## 2015-03-30 DIAGNOSIS — E7800 Pure hypercholesterolemia, unspecified: Secondary | ICD-10-CM | POA: Diagnosis not present

## 2015-05-30 DIAGNOSIS — H2513 Age-related nuclear cataract, bilateral: Secondary | ICD-10-CM | POA: Diagnosis not present

## 2015-06-22 DIAGNOSIS — E119 Type 2 diabetes mellitus without complications: Secondary | ICD-10-CM | POA: Diagnosis not present

## 2015-06-29 DIAGNOSIS — I10 Essential (primary) hypertension: Secondary | ICD-10-CM | POA: Diagnosis not present

## 2015-06-29 DIAGNOSIS — E119 Type 2 diabetes mellitus without complications: Secondary | ICD-10-CM | POA: Diagnosis not present

## 2015-09-21 DIAGNOSIS — E119 Type 2 diabetes mellitus without complications: Secondary | ICD-10-CM | POA: Diagnosis not present

## 2015-09-28 DIAGNOSIS — I10 Essential (primary) hypertension: Secondary | ICD-10-CM | POA: Diagnosis not present

## 2015-09-28 DIAGNOSIS — E119 Type 2 diabetes mellitus without complications: Secondary | ICD-10-CM | POA: Diagnosis not present

## 2015-10-02 DIAGNOSIS — R8290 Unspecified abnormal findings in urine: Secondary | ICD-10-CM | POA: Diagnosis not present

## 2015-12-28 DIAGNOSIS — E119 Type 2 diabetes mellitus without complications: Secondary | ICD-10-CM | POA: Diagnosis not present

## 2016-01-04 DIAGNOSIS — Z0000 Encounter for general adult medical examination without abnormal findings: Secondary | ICD-10-CM | POA: Diagnosis not present

## 2016-01-04 DIAGNOSIS — E119 Type 2 diabetes mellitus without complications: Secondary | ICD-10-CM | POA: Diagnosis not present

## 2016-01-04 DIAGNOSIS — I10 Essential (primary) hypertension: Secondary | ICD-10-CM | POA: Diagnosis not present

## 2016-03-31 DIAGNOSIS — E119 Type 2 diabetes mellitus without complications: Secondary | ICD-10-CM | POA: Diagnosis not present

## 2016-04-02 DIAGNOSIS — E7800 Pure hypercholesterolemia, unspecified: Secondary | ICD-10-CM | POA: Diagnosis not present

## 2016-04-02 DIAGNOSIS — E119 Type 2 diabetes mellitus without complications: Secondary | ICD-10-CM | POA: Diagnosis not present

## 2016-04-02 DIAGNOSIS — I10 Essential (primary) hypertension: Secondary | ICD-10-CM | POA: Diagnosis not present

## 2016-04-02 DIAGNOSIS — Z0001 Encounter for general adult medical examination with abnormal findings: Secondary | ICD-10-CM | POA: Diagnosis not present

## 2016-06-26 DIAGNOSIS — E119 Type 2 diabetes mellitus without complications: Secondary | ICD-10-CM | POA: Diagnosis not present

## 2016-07-03 DIAGNOSIS — M545 Low back pain: Secondary | ICD-10-CM | POA: Diagnosis not present

## 2016-07-03 DIAGNOSIS — E119 Type 2 diabetes mellitus without complications: Secondary | ICD-10-CM | POA: Diagnosis not present

## 2016-07-03 DIAGNOSIS — I10 Essential (primary) hypertension: Secondary | ICD-10-CM | POA: Diagnosis not present

## 2016-09-24 DIAGNOSIS — E119 Type 2 diabetes mellitus without complications: Secondary | ICD-10-CM | POA: Diagnosis not present

## 2016-10-01 DIAGNOSIS — I10 Essential (primary) hypertension: Secondary | ICD-10-CM | POA: Diagnosis not present

## 2016-10-01 DIAGNOSIS — E119 Type 2 diabetes mellitus without complications: Secondary | ICD-10-CM | POA: Diagnosis not present

## 2016-12-25 DIAGNOSIS — E119 Type 2 diabetes mellitus without complications: Secondary | ICD-10-CM | POA: Diagnosis not present

## 2017-01-01 DIAGNOSIS — I10 Essential (primary) hypertension: Secondary | ICD-10-CM | POA: Diagnosis not present

## 2017-01-01 DIAGNOSIS — E7800 Pure hypercholesterolemia, unspecified: Secondary | ICD-10-CM | POA: Diagnosis not present

## 2017-01-01 DIAGNOSIS — Z5181 Encounter for therapeutic drug level monitoring: Secondary | ICD-10-CM | POA: Diagnosis not present

## 2017-01-01 DIAGNOSIS — E119 Type 2 diabetes mellitus without complications: Secondary | ICD-10-CM | POA: Diagnosis not present

## 2017-03-25 DIAGNOSIS — Z5181 Encounter for therapeutic drug level monitoring: Secondary | ICD-10-CM | POA: Diagnosis not present

## 2017-03-25 DIAGNOSIS — E119 Type 2 diabetes mellitus without complications: Secondary | ICD-10-CM | POA: Diagnosis not present

## 2017-03-25 DIAGNOSIS — E7800 Pure hypercholesterolemia, unspecified: Secondary | ICD-10-CM | POA: Diagnosis not present

## 2017-04-02 DIAGNOSIS — Z8739 Personal history of other diseases of the musculoskeletal system and connective tissue: Secondary | ICD-10-CM | POA: Diagnosis not present

## 2017-04-02 DIAGNOSIS — E119 Type 2 diabetes mellitus without complications: Secondary | ICD-10-CM | POA: Diagnosis not present

## 2017-04-02 DIAGNOSIS — E7800 Pure hypercholesterolemia, unspecified: Secondary | ICD-10-CM | POA: Diagnosis not present

## 2017-04-02 DIAGNOSIS — K5900 Constipation, unspecified: Secondary | ICD-10-CM | POA: Diagnosis not present

## 2017-04-02 DIAGNOSIS — Z0001 Encounter for general adult medical examination with abnormal findings: Secondary | ICD-10-CM | POA: Diagnosis not present

## 2017-04-02 DIAGNOSIS — I10 Essential (primary) hypertension: Secondary | ICD-10-CM | POA: Diagnosis not present

## 2017-06-24 DIAGNOSIS — I10 Essential (primary) hypertension: Secondary | ICD-10-CM | POA: Diagnosis not present

## 2017-06-24 DIAGNOSIS — E119 Type 2 diabetes mellitus without complications: Secondary | ICD-10-CM | POA: Diagnosis not present

## 2017-07-01 DIAGNOSIS — E7800 Pure hypercholesterolemia, unspecified: Secondary | ICD-10-CM | POA: Diagnosis not present

## 2017-07-01 DIAGNOSIS — E119 Type 2 diabetes mellitus without complications: Secondary | ICD-10-CM | POA: Diagnosis not present

## 2017-07-01 DIAGNOSIS — I10 Essential (primary) hypertension: Secondary | ICD-10-CM | POA: Diagnosis not present

## 2017-07-01 DIAGNOSIS — Z0000 Encounter for general adult medical examination without abnormal findings: Secondary | ICD-10-CM | POA: Diagnosis not present

## 2017-07-01 DIAGNOSIS — Z8739 Personal history of other diseases of the musculoskeletal system and connective tissue: Secondary | ICD-10-CM | POA: Diagnosis not present

## 2017-09-24 DIAGNOSIS — E119 Type 2 diabetes mellitus without complications: Secondary | ICD-10-CM | POA: Diagnosis not present

## 2017-10-01 DIAGNOSIS — Z23 Encounter for immunization: Secondary | ICD-10-CM | POA: Diagnosis not present

## 2017-10-01 DIAGNOSIS — Z8739 Personal history of other diseases of the musculoskeletal system and connective tissue: Secondary | ICD-10-CM | POA: Diagnosis not present

## 2017-10-01 DIAGNOSIS — E119 Type 2 diabetes mellitus without complications: Secondary | ICD-10-CM | POA: Diagnosis not present

## 2017-10-01 DIAGNOSIS — E7800 Pure hypercholesterolemia, unspecified: Secondary | ICD-10-CM | POA: Diagnosis not present

## 2017-10-01 DIAGNOSIS — I10 Essential (primary) hypertension: Secondary | ICD-10-CM | POA: Diagnosis not present

## 2017-12-24 DIAGNOSIS — E119 Type 2 diabetes mellitus without complications: Secondary | ICD-10-CM | POA: Diagnosis not present

## 2017-12-31 DIAGNOSIS — H9193 Unspecified hearing loss, bilateral: Secondary | ICD-10-CM | POA: Diagnosis not present

## 2017-12-31 DIAGNOSIS — Z8739 Personal history of other diseases of the musculoskeletal system and connective tissue: Secondary | ICD-10-CM | POA: Diagnosis not present

## 2017-12-31 DIAGNOSIS — I10 Essential (primary) hypertension: Secondary | ICD-10-CM | POA: Diagnosis not present

## 2017-12-31 DIAGNOSIS — E119 Type 2 diabetes mellitus without complications: Secondary | ICD-10-CM | POA: Diagnosis not present

## 2017-12-31 DIAGNOSIS — L03116 Cellulitis of left lower limb: Secondary | ICD-10-CM | POA: Diagnosis not present

## 2017-12-31 DIAGNOSIS — E7800 Pure hypercholesterolemia, unspecified: Secondary | ICD-10-CM | POA: Diagnosis not present

## 2018-01-02 ENCOUNTER — Encounter: Payer: Self-pay | Admitting: Emergency Medicine

## 2018-01-02 ENCOUNTER — Other Ambulatory Visit: Payer: Self-pay

## 2018-01-02 ENCOUNTER — Observation Stay
Admission: EM | Admit: 2018-01-02 | Discharge: 2018-01-03 | Disposition: A | Discharge status: Home / Self Care | Payer: Medicare HMO | Attending: Internal Medicine | Admitting: Internal Medicine

## 2018-01-02 ENCOUNTER — Emergency Department: Payer: Medicare HMO

## 2018-01-02 DIAGNOSIS — I10 Essential (primary) hypertension: Secondary | ICD-10-CM | POA: Diagnosis not present

## 2018-01-02 DIAGNOSIS — N400 Enlarged prostate without lower urinary tract symptoms: Secondary | ICD-10-CM | POA: Diagnosis present

## 2018-01-02 DIAGNOSIS — R55 Syncope and collapse: Principal | ICD-10-CM | POA: Diagnosis present

## 2018-01-02 DIAGNOSIS — R0902 Hypoxemia: Secondary | ICD-10-CM | POA: Diagnosis not present

## 2018-01-02 DIAGNOSIS — E785 Hyperlipidemia, unspecified: Secondary | ICD-10-CM | POA: Diagnosis present

## 2018-01-02 DIAGNOSIS — M109 Gout, unspecified: Secondary | ICD-10-CM | POA: Insufficient documentation

## 2018-01-02 DIAGNOSIS — I493 Ventricular premature depolarization: Secondary | ICD-10-CM | POA: Diagnosis not present

## 2018-01-02 DIAGNOSIS — R42 Dizziness and giddiness: Secondary | ICD-10-CM | POA: Diagnosis not present

## 2018-01-02 DIAGNOSIS — Z79899 Other long term (current) drug therapy: Secondary | ICD-10-CM | POA: Insufficient documentation

## 2018-01-02 DIAGNOSIS — R000 Tachycardia, unspecified: Secondary | ICD-10-CM | POA: Diagnosis not present

## 2018-01-02 DIAGNOSIS — Z87891 Personal history of nicotine dependence: Secondary | ICD-10-CM | POA: Insufficient documentation

## 2018-01-02 DIAGNOSIS — Z7982 Long term (current) use of aspirin: Secondary | ICD-10-CM | POA: Diagnosis not present

## 2018-01-02 DIAGNOSIS — R21 Rash and other nonspecific skin eruption: Secondary | ICD-10-CM | POA: Insufficient documentation

## 2018-01-02 DIAGNOSIS — Z23 Encounter for immunization: Secondary | ICD-10-CM | POA: Insufficient documentation

## 2018-01-02 DIAGNOSIS — E876 Hypokalemia: Secondary | ICD-10-CM | POA: Diagnosis not present

## 2018-01-02 DIAGNOSIS — I959 Hypotension, unspecified: Secondary | ICD-10-CM | POA: Insufficient documentation

## 2018-01-02 DIAGNOSIS — R0689 Other abnormalities of breathing: Secondary | ICD-10-CM | POA: Diagnosis not present

## 2018-01-02 HISTORY — DX: Essential (primary) hypertension: I10

## 2018-01-02 HISTORY — DX: Hyperlipidemia, unspecified: E78.5

## 2018-01-02 HISTORY — DX: Benign prostatic hyperplasia without lower urinary tract symptoms: N40.0

## 2018-01-02 HISTORY — DX: Angioneurotic edema, initial encounter: T78.3XXA

## 2018-01-02 HISTORY — DX: Diverticulosis of intestine, part unspecified, without perforation or abscess without bleeding: K57.90

## 2018-01-02 LAB — URINALYSIS, COMPLETE (UACMP) WITH MICROSCOPIC
Bacteria, UA: NONE SEEN
Bilirubin Urine: NEGATIVE
Glucose, UA: NEGATIVE mg/dL
Hgb urine dipstick: NEGATIVE
Ketones, ur: 5 mg/dL — AB
Leukocytes, UA: NEGATIVE
Nitrite: NEGATIVE
Protein, ur: 100 mg/dL — AB
Specific Gravity, Urine: 1.028 (ref 1.005–1.030)
pH: 5 (ref 5.0–8.0)

## 2018-01-02 LAB — BASIC METABOLIC PANEL
Anion gap: 8 (ref 5–15)
BUN: 21 mg/dL (ref 8–23)
CO2: 26 mmol/L (ref 22–32)
CREATININE: 0.88 mg/dL (ref 0.61–1.24)
Calcium: 8 mg/dL — ABNORMAL LOW (ref 8.9–10.3)
Chloride: 106 mmol/L (ref 98–111)
GFR calc Af Amer: >60 mL/min (ref >60)
GFR calc non Af Amer: >60 mL/min (ref >60)
Glucose, Bld: 124 mg/dL — ABNORMAL HIGH (ref 70–99)
Potassium: 3.3 mmol/L — ABNORMAL LOW (ref 3.5–5.1)
Sodium: 140 mmol/L (ref 135–145)

## 2018-01-02 LAB — CBC
HCT: 41.8 % (ref 39.0–52.0)
Hemoglobin: 14.1 g/dL (ref 13.0–17.0)
MCH: 30.5 pg (ref 26.0–34.0)
MCHC: 33.7 g/dL (ref 30.0–36.0)
MCV: 90.3 fL (ref 80.0–100.0)
Platelets: 215 10*3/uL (ref 150–400)
RBC: 4.63 MIL/uL (ref 4.22–5.81)
RDW: 14.2 % (ref 11.5–15.5)
WBC: 5.2 10*3/uL (ref 4.0–10.5)
nRBC: 0 % (ref 0.0–0.2)

## 2018-01-02 LAB — TROPONIN I

## 2018-01-02 NOTE — ED Triage Notes (Signed)
Pt arrives via [facility] with c/o syncopal event at home. Per EMS, pt had CBG 108 with all other VS WDL. Pt does not remember event and EMS states that wife reported having to call pt's name several times before pt responded. Pt reports N/V at home as well as his wife. Pt is A/O at this time and in NAD.

## 2018-01-02 NOTE — H&P (Signed)
[facility] - [place] [facility]   PATIENT NAME: Brett DecampJohn Durnell    MR#:  [id number]  DATE OF BIRTH:  01-13-1930  DATE OF ADMISSION:  01/02/2018  PRIMARY CARE PHYSICIAN: Gracelyn NurseJohnston, Effie D, MD   REQUESTING/REFERRING PHYSICIAN: Cyril LoosenKinner, MD  CHIEF COMPLAINT:   Chief Complaint  Patient presents with  . Loss of Consciousness    HISTORY OF PRESENT ILLNESS:  Brett Johnston  is a 84 y.o. male who presents with chief complaint as above.  Patient presents to the ED after syncopal episode today.  He states that he was sitting in his chair watching TV, and he does not recall the syncopal event very well.  His wife was in the other room and states that she heard him get up, then heard him fall.  When she went to talk to him he did not respond to her the first time she called his name, then he opened his eyes and started to wake up.  He has no prior history of cardiac disease.  His initial work-up here in the ED is largely within normal limits except for frequent PVCs/short runs of V. tach.  Hospitalist were called for admission and further evaluation  PAST MEDICAL HISTORY:   Past Medical History:  Diagnosis Date  . Angioedema   . BPH (benign prostatic hyperplasia)   . Diverticulosis   . HLD (hyperlipidemia)   . HTN (hypertension)      PAST SURGICAL HISTORY:   Past Surgical History:  Procedure Laterality Date  . HERNIA REPAIR    . JOINT REPLACEMENT       SOCIAL HISTORY:   Social History   Tobacco Use  . Smoking status: Former Games developermoker  . Smokeless tobacco: Never Used  Substance Use Topics  . Alcohol use: No     FAMILY HISTORY:   Family History  Problem Relation Age of Onset  . Breast cancer Sister   . Colon cancer Brother      DRUG ALLERGIES:  No Known Allergies  MEDICATIONS AT HOME:   Prior to Admission medications   Medication Sig Start Date End Date Taking? Authorizing Provider  allopurinol (ZYLOPRIM) 300 MG tablet Take 300 mg by mouth  daily.   Yes [provider]  amLODipine (NORVASC) 5 MG tablet Take 5 mg by mouth daily.   Yes [provider]  aspirin EC 325 MG tablet Take 325 mg by mouth daily.   Yes [provider]  cephALEXin (KEFLEX) 500 MG capsule Take 500 mg by mouth 3 (three) times daily. 12/31/17 01/10/18 Yes [provider]  hydrochlorothiazide (HYDRODIURIL) 25 MG tablet Take 25 mg by mouth daily.   Yes [provider]  magnesium oxide (MAG-OX) 400 MG tablet Take 400 mg by mouth daily.   Yes [provider]  potassium chloride (K-DUR,KLOR-CON) 10 MEQ tablet Take 10 mEq by mouth daily.   Yes [provider]  simvastatin (ZOCOR) 20 MG tablet Take 20 mg by mouth daily.   Yes [provider]  tamsulosin (FLOMAX) 0.4 MG CAPS capsule Take 0.4 mg by mouth daily. 04/02/17  Yes [provider]  meloxicam (MOBIC) 7.5 MG tablet Take 1 tablet (7.5 mg total) by mouth daily. Patient not taking: Reported on 01/02/2018 08/02/14   Joni ReiningSmith, Ronald K, PA-C    REVIEW OF SYSTEMS:  Review of Systems  Constitutional: Negative for chills, fever, malaise/fatigue and weight loss.  HENT: Negative for ear pain, hearing loss and tinnitus.   Eyes: Negative for blurred  vision, double vision, pain and redness.  Respiratory: Negative for cough, hemoptysis and shortness of breath.   Cardiovascular: Negative for chest pain, palpitations, orthopnea and leg swelling.  Gastrointestinal: Negative for abdominal pain, constipation, diarrhea, nausea and vomiting.  Genitourinary: Negative for dysuria, frequency and hematuria.  Musculoskeletal: Negative for back pain, joint pain and neck pain.  Skin:       No acne, rash, or lesions  Neurological: Positive for loss of consciousness. Negative for dizziness, tremors, focal weakness and weakness.  Endo/Heme/Allergies: Negative for polydipsia. Does not bruise/bleed easily.  Psychiatric/Behavioral: Negative for depression. The patient  is not nervous/anxious and does not have insomnia.      VITAL SIGNS:   Vitals:   01/02/18 2030 01/02/18 2100 01/02/18 2130 01/02/18 2200  BP: 100/70 108/87 109/83 109/63  Pulse: 87 89 82 87  Resp: 17 (!) 21 (!) 27 (!) 31  Temp:      TempSrc:      SpO2: 95% 90% 100% 98%  Weight:      Height:       Wt Readings from Last 3 Encounters:  01/02/18 59.9 kg  09/13/14 83.9 kg  08/02/14 83.9 kg    PHYSICAL EXAMINATION:  Physical Exam  Vitals reviewed. Constitutional: He is oriented to person, place, and time. He appears well-developed and well-nourished. No distress.  HENT:  Head: Normocephalic and atraumatic.  Mouth/Throat: Oropharynx is clear and moist.  Eyes: Pupils are equal, round, and reactive to light. Conjunctivae and EOM are normal. No scleral icterus.  Neck: Normal range of motion. Neck supple. No JVD present. No thyromegaly present.  Cardiovascular: Normal rate, regular rhythm and intact distal pulses. Exam reveals no gallop and no friction rub.  No murmur heard. Frequent PVCs  Respiratory: Effort normal and breath sounds normal. No respiratory distress. He has no wheezes. He has no rales.  GI: Soft. Bowel sounds are normal. He exhibits no distension. There is no abdominal tenderness.  Musculoskeletal: Normal range of motion.        General: No edema.     Comments: No arthritis, no gout  Lymphadenopathy:    He has no cervical adenopathy.  Neurological: He is alert and oriented to person, place, and time. No cranial nerve deficit.  No dysarthria, no aphasia  Skin: Skin is warm and dry. No rash noted. No erythema.  Psychiatric: He has a normal mood and affect. His behavior is normal. Judgment and thought content normal.    LABORATORY PANEL:   CBC Recent Labs  Lab 01/02/18 1959  WBC 5.2  HGB 14.1  HCT 41.8  PLT 215   ------------------------------------------------------------------------------------------------------------------  Chemistries  Recent Labs   Lab 01/02/18 1959  NA 140  K 3.3*  CL 106  CO2 26  GLUCOSE 124*  BUN 21  CREATININE 0.88  CALCIUM 8.0*   ------------------------------------------------------------------------------------------------------------------  Cardiac Enzymes Recent Labs  Lab 01/02/18 1959  TROPONINI <0.03   ------------------------------------------------------------------------------------------------------------------  RADIOLOGY:  Dg Chest Port 1 View  Result Date: 01/02/2018 CLINICAL DATA:  Syncope EXAM: PORTABLE CHEST 1 VIEW COMPARISON:  February 21, 2013 FINDINGS: The heart size and mediastinal contours are within normal limits. Both lungs are clear. The visualized skeletal structures are stable. IMPRESSION: No active cardiopulmonary disease. Electronically Signed   By: Sherian Rein M.D.   On: 01/02/2018 21:26    EKG:   Orders placed or performed during the hospital encounter of 01/02/18  . ED EKG  . ED EKG    IMPRESSION AND PLAN:  Principal Problem:  Syncope -possible arrhythmia is high on the differential.  Will admit him for cardiac work-up with echocardiogram and cardiology consult Active Problems:   Frequent PVCs -calcium given, work-up as above   HTN (hypertension) -hold antihypertensives for now as the patient's blood pressure is borderline low   HLD (hyperlipidemia) -Home dose antilipid   BPH (benign prostatic hyperplasia) -continue home meds  Chart review performed and case discussed with ED provider. Labs, imaging and/or ECG reviewed by provider and discussed with patient/family. Management plans discussed with the patient and/or family.  DVT PROPHYLAXIS: SubQ lovenox   GI PROPHYLAXIS:  None   ADMISSION STATUS: Observation  CODE STATUS: Full  TOTAL TIME TAKING CARE OF THIS PATIENT: 40 minutes.   Louella Medaglia FIELDING 01/02/2018, 11:56 PM  [facility]  Office  [phone]  CC: Primary care physician; Gracelyn NurseJohnston, Travante D, MD  Note:  This  document was prepared using Dragon voice recognition software and may include unintentional dictation errors.

## 2018-01-02 NOTE — ED Provider Notes (Signed)
[facility] Emergency Department Provider Note   ____________________________________________    I have reviewed the triage vital signs and the nursing notes.   HISTORY  Chief Complaint Loss of Consciousness     HPI Brett Johnston is a 84 y.o. male who presents after a syncopal episode.  Patient reports that he got up from the couch and walked to the kitchen and that is all he remembers.  His wife reports she heard a "thump" and found him lying on his back dazed.  No seizure activity.  No injury from the fall.  Patient is on blood thinners.  No history of arrhythmia.  No nausea or vomiting or chest pain.  No shortness of breath.   Past Medical History:  Diagnosis Date  . Angioedema   . BPH (benign prostatic hyperplasia)   . Diverticulosis   . HLD (hyperlipidemia)   . HTN (hypertension)     Patient Active Problem List   Diagnosis Date Noted  . Syncope 01/02/2018  . Frequent PVCs 01/02/2018  . HTN (hypertension) 01/02/2018  . HLD (hyperlipidemia) 01/02/2018  . BPH (benign prostatic hyperplasia) 01/02/2018    Past Surgical History:  Procedure Laterality Date  . HERNIA REPAIR    . JOINT REPLACEMENT      Prior to Admission medications   Medication Sig Start Date End Date Taking? Authorizing Provider  allopurinol (ZYLOPRIM) 300 MG tablet Take 300 mg by mouth daily.   Yes [provider]  amLODipine (NORVASC) 5 MG tablet Take 5 mg by mouth daily.   Yes [provider]  aspirin EC 325 MG tablet Take 325 mg by mouth daily.   Yes [provider]  cephALEXin (KEFLEX) 500 MG capsule Take 500 mg by mouth 3 (three) times daily. 12/31/17 01/10/18 Yes [provider]  hydrochlorothiazide (HYDRODIURIL) 25 MG tablet Take 25 mg by mouth daily.   Yes [provider]  magnesium oxide (MAG-OX) 400 MG tablet Take 400 mg by mouth daily.   Yes [provider]  potassium chloride (K-DUR,KLOR-CON) 10 MEQ tablet  Take 10 mEq by mouth daily.   Yes [provider]  simvastatin (ZOCOR) 20 MG tablet Take 20 mg by mouth daily.   Yes [provider]  tamsulosin (FLOMAX) 0.4 MG CAPS capsule Take 0.4 mg by mouth daily. 04/02/17  Yes [provider]  meloxicam (MOBIC) 7.5 MG tablet Take 1 tablet (7.5 mg total) by mouth daily. Patient not taking: Reported on 01/02/2018 08/02/14   Joni Reining, PA-C     Allergies Patient has no known allergies.  Family History  Problem Relation Age of Onset  . Breast cancer Sister   . Colon cancer Brother     Social History Social History   Tobacco Use  . Smoking status: Former Games developer  . Smokeless tobacco: Never Used  Substance Use Topics  . Alcohol use: No  . Drug use: No    Review of Systems  Constitutional: No fever/chills Eyes: No visual changes.  ENT: No neck pain. Cardiovascular: Denies chest pain. Respiratory: Denies shortness of breath. Gastrointestinal: No abdominal pain.   Genitourinary: Negative for dysuria. Musculoskeletal: Negative for back pain. Skin: Negative for rash. Neurological: Negative for headaches or weakness   ____________________________________________   PHYSICAL EXAM:  VITAL SIGNS: ED Triage Vitals  Enc Vitals Group     BP 01/02/18 1946 114/74     Pulse Rate 01/02/18 1946 99     Resp 01/02/18 1946 (!) 26  Temp 01/02/18 1946 98.2 F (36.8 C)     Temp Source 01/02/18 1946 Oral     SpO2 01/02/18 1946 97 %     Weight 01/02/18 1953 59.9 kg (132 lb)     Height 01/02/18 1953 1.702 m (5\' 7" )     Head Circumference --      Peak Flow --      Pain Score 01/02/18 1953 0     Pain Loc --      Pain Edu? --      Excl. in GC? --     Constitutional: Alert and oriented.  Eyes: Conjunctivae are normal.  Head: Atraumatic. Nose: No congestion/rhinnorhea. Mouth/Throat: Mucous membranes are moist.   Neck:  Painless ROM, no vertebral tenderness palpation Cardiovascular: Normal rate, regular rhythm,  occasional skipped beats. Grossly normal heart sounds.  Good peripheral circulation. Respiratory: Normal respiratory effort.  No retractions. Lungs CTAB. Gastrointestinal: Soft and nontender. No distention.   Musculoskeletal: No lower extremity tenderness nor edema.   Neurologic:  Normal speech and language. No gross focal neurologic deficits are appreciated.  Skin:  Skin is warm, dry and intact. No rash noted. Psychiatric: Mood and affect are normal. Speech and behavior are normal.  ____________________________________________   LABS (all labs ordered are listed, but only abnormal results are displayed)  Labs Reviewed  BASIC METABOLIC PANEL - Abnormal; Notable for the following components:      Result Value   Potassium 3.3 (*)    Glucose, Bld 124 (*)    Calcium 8.0 (*)    All other components within normal limits  URINALYSIS, COMPLETE (UACMP) WITH MICROSCOPIC - Abnormal; Notable for the following components:   Color, Urine AMBER (*)    APPearance CLOUDY (*)    Ketones, ur 5 (*)    Protein, ur 100 (*)    All other components within normal limits  BASIC METABOLIC PANEL - Abnormal; Notable for the following components:   Potassium 3.0 (*)    BUN 26 (*)    Calcium 7.9 (*)    GFR calc non Af Amer 59 (*)    All other components within normal limits  CBC - Abnormal; Notable for the following components:   HCT 38.1 (*)    All other components within normal limits  CBC  TROPONIN I  MAGNESIUM  CBG MONITORING, ED   ____________________________________________  EKG  ED ECG REPORT I, Jene Everyobert Daneille Desilva, the attending physician, personally viewed and interpreted this ECG.  Date: 01/02/2018 EKG Time: 7:51 PM Rate: 101 Rhythm: Sinus tachycardia QRS Axis: normal Intervals: Right bundle branch block and left anterior fascicular block ST/T Wave abnormalities: normal Narrative Interpretation: no evidence of acute  ischemia  ____________________________________________  RADIOLOGY  Chest x-ray ____________________________________________   PROCEDURES  Procedure(s) performed: No  Procedures   Critical Care performed: No ____________________________________________   INITIAL IMPRESSION / ASSESSMENT AND PLAN / ED COURSE  Pertinent labs & imaging results that were available during my care of the patient were reviewed by me and considered in my medical decision making (see chart for details).  Patient presents after syncopal episode.  He feels well currently and has no complaints.  While watching the patient's monitor I noted occasional skipped beats followed by PVC then followed by brief run of wide-complex tachycardia suspicious for V. tach.  Patient had no symptoms during these episodes.  He will require admission for further evaluation    ____________________________________________   FINAL CLINICAL IMPRESSION(S) / ED DIAGNOSES  Final diagnoses:  Syncope and  collapse        Note:  This document was prepared using Dragon voice recognition software and may include unintentional dictation errors.    Jene EveryKinner, Isella Slatten, MD 01/03/18 [phone]

## 2018-01-03 DIAGNOSIS — K529 Noninfective gastroenteritis and colitis, unspecified: Secondary | ICD-10-CM | POA: Diagnosis not present

## 2018-01-03 DIAGNOSIS — I959 Hypotension, unspecified: Secondary | ICD-10-CM | POA: Diagnosis not present

## 2018-01-03 DIAGNOSIS — R55 Syncope and collapse: Secondary | ICD-10-CM | POA: Diagnosis not present

## 2018-01-03 DIAGNOSIS — E785 Hyperlipidemia, unspecified: Secondary | ICD-10-CM | POA: Diagnosis not present

## 2018-01-03 DIAGNOSIS — I493 Ventricular premature depolarization: Secondary | ICD-10-CM | POA: Diagnosis not present

## 2018-01-03 LAB — BASIC METABOLIC PANEL
Anion gap: 10 (ref 5–15)
BUN: 26 mg/dL — ABNORMAL HIGH (ref 8–23)
CO2: 22 mmol/L (ref 22–32)
Calcium: 7.9 mg/dL — ABNORMAL LOW (ref 8.9–10.3)
Chloride: 104 mmol/L (ref 98–111)
Creatinine, Ser: 1.13 mg/dL (ref 0.61–1.24)
GFR calc Af Amer: >60 mL/min (ref >60)
GFR calc non Af Amer: 59 mL/min — ABNORMAL LOW (ref >60)
GLUCOSE: 99 mg/dL (ref 70–99)
Potassium: 3 mmol/L — ABNORMAL LOW (ref 3.5–5.1)
Sodium: 136 mmol/L (ref 135–145)

## 2018-01-03 LAB — MAGNESIUM: Magnesium: 2 mg/dL (ref 1.7–2.4)

## 2018-01-03 LAB — CBC
HEMATOCRIT: 38.1 % — AB (ref 39.0–52.0)
Hemoglobin: 13 g/dL (ref 13.0–17.0)
MCH: 30.7 pg (ref 26.0–34.0)
MCHC: 34.1 g/dL (ref 30.0–36.0)
MCV: 89.9 fL (ref 80.0–100.0)
Platelets: 183 10*3/uL (ref 150–400)
RBC: 4.24 MIL/uL (ref 4.22–5.81)
RDW: 14.2 % (ref 11.5–15.5)
WBC: 7.2 10*3/uL (ref 4.0–10.5)
nRBC: 0 % (ref 0.0–0.2)

## 2018-01-03 MED ORDER — ASPIRIN EC 325 MG PO TBEC
325.0000 mg | DELAYED_RELEASE_TABLET | Freq: Every day | ORAL | Status: DC
  Administered 2018-01-03: 325 mg via ORAL
  Filled 2018-01-03: qty 1

## 2018-01-03 MED ORDER — ACETAMINOPHEN 325 MG PO TABS: 650.0000 mg | ORAL_TABLET | Freq: Four times a day (QID) | ORAL | Status: DC | PRN

## 2018-01-03 MED ORDER — POTASSIUM CHLORIDE CRYS ER 20 MEQ PO TBCR
40.0000 meq | EXTENDED_RELEASE_TABLET | Freq: Once | ORAL | Status: AC
  Administered 2018-01-03: 40 meq via ORAL
  Filled 2018-01-03: qty 2

## 2018-01-03 MED ORDER — POTASSIUM CHLORIDE IN NACL 20-0.9 MEQ/L-% IV SOLN
INTRAVENOUS | Status: DC
  Administered 2018-01-03: 12:00:00 via INTRAVENOUS
  Filled 2018-01-03 (×2): qty 1000

## 2018-01-03 MED ORDER — PNEUMOCOCCAL VAC POLYVALENT 25 MCG/0.5ML IJ INJ
0.5000 mL | INJECTION | Freq: Once | INTRAMUSCULAR | Status: AC
  Administered 2018-01-03: 0.5 mL via INTRAMUSCULAR
  Filled 2018-01-03: qty 0.5

## 2018-01-03 MED ORDER — TAMSULOSIN HCL 0.4 MG PO CAPS: 0.4000 mg | ORAL_CAPSULE | Freq: Every day | ORAL | Status: DC

## 2018-01-03 MED ORDER — ONDANSETRON HCL 4 MG PO TABS: 4.0000 mg | ORAL_TABLET | Freq: Four times a day (QID) | ORAL | Status: DC | PRN

## 2018-01-03 MED ORDER — PNEUMOCOCCAL VAC POLYVALENT 25 MCG/0.5ML IJ INJ: 0.5000 mL | INJECTION | INTRAMUSCULAR | Status: DC

## 2018-01-03 MED ORDER — CALCIUM GLUCONATE-NACL 1-0.675 GM/50ML-% IV SOLN
1.0000 g | Freq: Once | INTRAVENOUS | Status: AC
  Administered 2018-01-03: 1000 mg via INTRAVENOUS
  Filled 2018-01-03: qty 50

## 2018-01-03 MED ORDER — ACETAMINOPHEN 650 MG RE SUPP: 650.0000 mg | Freq: Four times a day (QID) | RECTAL | Status: DC | PRN

## 2018-01-03 MED ORDER — ONDANSETRON HCL 4 MG/2ML IJ SOLN: 4.0000 mg | Freq: Four times a day (QID) | INTRAMUSCULAR | Status: DC | PRN

## 2018-01-03 MED ORDER — SODIUM CHLORIDE 0.9 % IV BOLUS
500.0000 mL | Freq: Once | INTRAVENOUS | Status: AC
  Administered 2018-01-03: 500 mL via INTRAVENOUS

## 2018-01-03 MED ORDER — ENOXAPARIN SODIUM 40 MG/0.4ML ~~LOC~~ SOLN
40.0000 mg | SUBCUTANEOUS | Status: DC
  Administered 2018-01-03: 40 mg via SUBCUTANEOUS
  Filled 2018-01-03: qty 0.4

## 2018-01-03 MED ORDER — SODIUM CHLORIDE 0.9% FLUSH
3.0000 mL | Freq: Two times a day (BID) | INTRAVENOUS | Status: DC
  Administered 2018-01-03: 3 mL via INTRAVENOUS

## 2018-01-03 MED ORDER — SIMVASTATIN 10 MG PO TABS
20.0000 mg | ORAL_TABLET | Freq: Every day | ORAL | Status: DC
  Administered 2018-01-03: 20 mg via ORAL
  Filled 2018-01-03: qty 2

## 2018-01-03 NOTE — Discharge Summary (Signed)
[facility] - [place] [facility]   PATIENT NAME: Brett DecampJohn Lisanti    MR#:  [id number]  DATE OF BIRTH:  01/24/1930  DATE OF ADMISSION:  01/02/2018 ADMITTING PHYSICIAN: Oralia Manisavid Willis, MD  DATE OF DISCHARGE: 01/03/2018  PRIMARY CARE PHYSICIAN: Gracelyn NurseJohnston, Macdonald D, MD    ADMISSION DIAGNOSIS:  Syncope and collapse [R55]  DISCHARGE DIAGNOSIS:  Principal Problem:   Syncope Active Problems:   Frequent PVCs   HTN (hypertension)   HLD (hyperlipidemia)   BPH (benign prostatic hyperplasia)   SECONDARY DIAGNOSIS:   Past Medical History:  Diagnosis Date  . Angioedema   . BPH (benign prostatic hyperplasia)   . Diverticulosis   . HLD (hyperlipidemia)   . HTN (hypertension)     HOSPITAL COURSE:   1.  Syncope likely secondary to a viral gastroenteritis since the patient had nausea vomiting and diarrhea.  The patient's wife recently had symptoms and is now better.  Blood pressure on the lower side also.  He was given IV fluid hydration.  He is not orthostatic.  The patient walked around and he felt okay. 2.  Relative hypotension.  Hold hydrochlorothiazide and Norvasc at this time. 3.  Hypokalemia.  This was replaced orally and IV fluids.  Stopping hydrochlorothiazide should help. 4.  BPH.  Can go back on Flomax as outpatient 5.  Hyperlipidemia unspecified on Zocor 6.  Patient was recently placed on Keflex for couple spots on his right lower extremity that look okay at this point I told him he can just finish out that prescription. 7.  History of gout on allopurinol   DISCHARGE CONDITIONS:   Satisfactory  CONSULTS OBTAINED:  Treatment Team:  Regan Lemmingamnitz, Will Martin, MD  DRUG ALLERGIES:  No Known Allergies  DISCHARGE MEDICATIONS:   Allergies as of 01/03/2018   No Known Allergies     Medication List    STOP taking these medications   amLODipine 5 MG tablet Commonly known as:  NORVASC   hydrochlorothiazide 25 MG tablet Commonly known as:  HYDRODIURIL    meloxicam 7.5 MG tablet Commonly known as:  MOBIC     TAKE these medications   allopurinol 300 MG tablet Commonly known as:  ZYLOPRIM Take 300 mg by mouth daily.   aspirin EC 325 MG tablet Take 325 mg by mouth daily.   cephALEXin 500 MG capsule Commonly known as:  KEFLEX Take 500 mg by mouth 3 (three) times daily.   magnesium oxide 400 MG tablet Commonly known as:  MAG-OX Take 400 mg by mouth daily.   potassium chloride 10 MEQ tablet Commonly known as:  K-DUR,KLOR-CON Take 10 mEq by mouth daily. Notes to patient:  TAKE WITH FOOD   simvastatin 20 MG tablet Commonly known as:  ZOCOR Take 20 mg by mouth daily.   tamsulosin 0.4 MG Caps capsule Commonly known as:  FLOMAX Take 0.4 mg by mouth daily.        DISCHARGE INSTRUCTIONS:   Follow-up with PMD 5 days  If you experience worsening of your admission symptoms, develop shortness of breath, life threatening emergency, suicidal or homicidal thoughts you must seek medical attention immediately by calling 911 or calling your MD immediately  if symptoms less severe.  You Must read complete instructions/literature along with all the possible adverse reactions/side effects for all the Medicines you take and that have been prescribed to you. Take any new Medicines after you have completely understood and accept all the possible adverse reactions/side effects.   Please note  You  were cared for by a hospitalist during your hospital stay. If you have any questions about your discharge medications or the care you received while you were in the hospital after you are discharged, you can call the unit and asked to speak with the hospitalist on call if the hospitalist that took care of you is not available. Once you are discharged, your primary care physician will handle any further medical issues. Please note that NO REFILLS for any discharge medications will be authorized once you are discharged, as it is imperative that you return to  your primary care physician (or establish a relationship with a primary care physician if you do not have one) for your aftercare needs so that they can reassess your need for medications and monitor your lab values.    Today   CHIEF COMPLAINT:   Chief Complaint  Patient presents with  . Loss of Consciousness    HISTORY OF PRESENT ILLNESS:  Brett Johnston  is a 84 y.o. male came in with syncope   VITAL SIGNS:  Blood pressure 110/84, pulse 87, temperature 100 F (37.8 C), temperature source Oral, resp. rate 18, height 5\' 7"  (1.702 m), weight 75.1 kg, SpO2 95 %.    PHYSICAL EXAMINATION:  GENERAL:  84 y.o.-year-old patient lying in the bed with no acute distress.  EYES: Pupils equal, round, reactive to light and accommodation. No scleral icterus. Extraocular muscles intact.  HEENT: Head atraumatic, normocephalic. Oropharynx and nasopharynx clear.  NECK:  Supple, no jugular venous distention. No thyroid enlargement, no tenderness.  LUNGS: Normal breath sounds bilaterally, no wheezing, rales,rhonchi or crepitation. No use of accessory muscles of respiration.  CARDIOVASCULAR: S1, S2 normal. No murmurs, rubs, or gallops.  ABDOMEN: Soft, non-tender, non-distended. Bowel sounds present. No organomegaly or mass.  EXTREMITIES: No pedal edema, cyanosis, or clubbing.  NEUROLOGIC: Cranial nerves II through XII are intact. Muscle strength 5/5 in all extremities. Sensation intact. Gait not checked.  PSYCHIATRIC: The patient is alert and oriented x 3.  SKIN: No obvious rash, lesion, or ulcer.   DATA REVIEW:   CBC Recent Labs  Lab 01/03/18 0404  WBC 7.2  HGB 13.0  HCT 38.1*  PLT 183    Chemistries  Recent Labs  Lab 01/03/18 0404  NA 136  K 3.0*  CL 104  CO2 22  GLUCOSE 99  BUN 26*  CREATININE 1.13  CALCIUM 7.9*  MG 2.0    Cardiac Enzymes Recent Labs  Lab 01/02/18 1959  TROPONINI <0.03     RADIOLOGY:  Dg Chest Port 1 View  Result Date: 01/02/2018 CLINICAL DATA:   Syncope EXAM: PORTABLE CHEST 1 VIEW COMPARISON:  February 21, 2013 FINDINGS: The heart size and mediastinal contours are within normal limits. Both lungs are clear. The visualized skeletal structures are stable. IMPRESSION: No active cardiopulmonary disease. Electronically Signed   By: Sherian ReinWei-Chen  Lin M.D.   On: 01/02/2018 21:26    EKG:   Sinus rhythm with sinus arrhythmia.    Management plans discussed with the patient, family and they are in agreement.  CODE STATUS:     Code Status Orders  (From admission, onward)         Start     Ordered   01/03/18 0130  Full code  Continuous     01/03/18 0129        Code Status History    This patient has a current code status but no historical code status.      TOTAL TIME TAKING  CARE OF THIS PATIENT: 35 minutes.    Alford Highland M.D on 01/03/2018 at 3:12 PM  Between 7am to 6pm - Pager - [phone]  After 6pm go to [email or web address] - password [email or web address]  [facility] Office  [phone]  CC: Primary care physician; Gracelyn Nurse, MD

## 2018-01-03 NOTE — Progress Notes (Signed)
Discharged to home with wife and son.  BP meds on hold until he sees his PCP next week.  No new medications started.  They will schedule appointment with PCP.

## 2018-01-03 NOTE — ED Notes (Signed)
ED TO INPATIENT HANDOFF REPORT  Name/Age/Gender Brett Johnston 84 y.o. male  Code Status   Home/SNF/Other Home  Chief Complaint Syncope  Level of Care/Admitting Diagnosis ED Disposition    ED Disposition Condition Comment   Admit  Hospital Area: [facility] [[id number]]  Level of Care: Telemetry [5]  Diagnosis: Syncope [206001]  Admitting Physician: Oralia ManisWILLIS, DAVID [[id number]]  Attending Physician: Oralia ManisWILLIS, DAVID [phone][[id number]]  Bed request comments: 2a  PT Class (Do Not Modify): Observation [104]  PT Acc Code (Do Not Modify): Observation [10022]       Medical History Past Medical History:  Diagnosis Date  . Angioedema   . BPH (benign prostatic hyperplasia)   . Diverticulosis   . HLD (hyperlipidemia)   . HTN (hypertension)     Allergies No Known Allergies  IV Location/Drains/Wounds Patient Lines/Drains/Airways Status   Active Line/Drains/Airways    Name:   Placement date:   Placement time:   Site:   Days:   Peripheral IV 01/02/18 Left Antecubital   01/02/18    -    Antecubital   1          Labs/Imaging Results for orders placed or performed during the hospital encounter of 01/02/18 (from the past 48 hour(s))  Basic metabolic panel     Status: Abnormal   Collection Time: 01/02/18  7:59 PM  Result Value Ref Range   Sodium 140 135 - 145 mmol/L   Potassium 3.3 (L) 3.5 - 5.1 mmol/L   Chloride 106 98 - 111 mmol/L   CO2 26 22 - 32 mmol/L   Glucose, Bld 124 (H) 70 - 99 mg/dL   BUN 21 8 - 23 mg/dL   Creatinine, Ser 1.910.88 0.61 - 1.24 mg/dL   Calcium 8.0 (L) 8.9 - 10.3 mg/dL   GFR calc non Af Amer >60 >60 mL/min   GFR calc Af Amer >60 >60 mL/min   Anion gap 8 5 - 15    Comment: Performed at [facility], [address] [place], [place] [address]  CBC     Status: None   Collection Time: 01/02/18  7:59 PM  Result Value Ref Range   WBC 5.2 4.0 - 10.5 K/uL   RBC 4.63 4.22 - 5.81 MIL/uL   Hemoglobin 14.1 13.0 - 17.0 g/dL   HCT 56.241.8 13.039.0 -  86.552.0 %   MCV 90.3 80.0 - 100.0 fL   MCH 30.5 26.0 - 34.0 pg   MCHC 33.7 30.0 - 36.0 g/dL   RDW 78.414.2 69.611.5 - 29.515.5 %   Platelets 215 150 - 400 K/uL   nRBC 0.0 0.0 - 0.2 %    Comment: Performed at [facility], [address] [place], [place] [address]  Urinalysis, Complete w Microscopic     Status: Abnormal   Collection Time: 01/02/18  7:59 PM  Result Value Ref Range   Color, Urine AMBER (A) YELLOW    Comment: BIOCHEMICALS MAY BE AFFECTED BY COLOR   APPearance CLOUDY (A) CLEAR   Specific Gravity, Urine 1.028 1.005 - 1.030   pH 5.0 5.0 - 8.0   Glucose, UA NEGATIVE NEGATIVE mg/dL   Hgb urine dipstick NEGATIVE NEGATIVE   Bilirubin Urine NEGATIVE NEGATIVE   Ketones, ur 5 (A) NEGATIVE mg/dL   Protein, ur 244100 (A) NEGATIVE mg/dL   Nitrite NEGATIVE NEGATIVE   Leukocytes, UA NEGATIVE NEGATIVE   RBC / HPF 6-10 0 - 5 RBC/hpf   WBC, UA 0-5 0 - 5 WBC/hpf   Bacteria,  UA NONE SEEN NONE SEEN   Squamous Epithelial / LPF 0-5 0 - 5   Mucus PRESENT    Hyaline Casts, UA PRESENT     Comment: Performed at [facility], [address] [place], [place] [address]  Troponin I - Add-On to previous collection     Status: None   Collection Time: 01/02/18  7:59 PM  Result Value Ref Range   Troponin I <0.03 <0.03 ng/mL    Comment: Performed at [facility], [address] [place], [place] [address]   Dg Chest Port 1 View  Result Date: 01/02/2018 CLINICAL DATA:  Syncope EXAM: PORTABLE CHEST 1 VIEW COMPARISON:  February 21, 2013 FINDINGS: The heart size and mediastinal contours are within normal limits. Both lungs are clear. The visualized skeletal structures are stable. IMPRESSION: No active cardiopulmonary disease. Electronically Signed   By: Sherian ReinWei-Chen  Lin M.D.   On: 01/02/2018 21:26    Pending Labs [employer]ed Labs (From admission, onward)    Start     Ordered   Signed and Held  CBC  (enoxaparin (LOVENOX)    CrCl >/= 30 ml/min)  Once,   R    Comments:  Baseline for  enoxaparin therapy IF NOT ALREADY DRAWN.  Notify MD if PLT < 100 K.    Signed and Held   Signed and Held  Creatinine, serum  (enoxaparin (LOVENOX)    CrCl >/= 30 ml/min)  Once,   R    Comments:  Baseline for enoxaparin therapy IF NOT ALREADY DRAWN.    Signed and Held   Signed and Held  Creatinine, serum  (enoxaparin (LOVENOX)    CrCl >/= 30 ml/min)  Weekly,   R    Comments:  while on enoxaparin therapy    Signed and Held   Signed and Held  Basic metabolic panel  Tomorrow morning,   R     Signed and Held   Signed and Held  CBC  Tomorrow morning,   R     Signed and Held          Vitals/Pain Today's Vitals   01/02/18 2300 01/02/18 2330 01/03/18 0030 01/03/18 0100  BP: 117/61 108/66 109/67 102/61  Pulse: 100 100 100 (!) 102  Resp: (!) 36 (!) 35 (!) 22 (!) 26  Temp:      TempSrc:      SpO2: 95% 95% 93% 93%  Weight:      Height:      PainSc:        Isolation Precautions No active isolations  Medications Medications - No data to display  Mobility walks

## 2018-01-03 NOTE — Plan of Care (Signed)
Pt admitted to room 239 A&Ox4, denies c/o pain or SOB. Admission assessment completed. Telemetry placed on pt, pt oriented to room. Call light within reach.

## 2018-01-03 NOTE — Discharge Instructions (Signed)
Near-Syncope  Near-syncope is when you suddenly become weak or dizzy, or you feel like you might pass out (faint). During an episode of near-syncope, you may:   Feel dizzy or light-headed.   Feel nauseous.   See all white or all black in your field of vision.   Have cold, clammy skin.  This condition is caused by a sudden decrease in blood flow to the brain. This decrease can result from various causes, but most of those causes are not dangerous. However, near-syncope can be a sign of a serious medical problem, so it is important to seek medical care.  If you fainted, get medical help right away.Call your local emergency services (911 in the [place] Do not drive yourself to the hospital.  Follow these instructions at home:  Pay attention to any changes in your symptoms. Take these actions to help with your condition:   Have someone stay with you until you feel stable.   Do not drive, use machinery, or play sports until your health care provider says it is okay.   Keep all follow-up visits as told by your health care provider. This is important.   If you start to feel like you might faint, lie down right away and raise (elevate) your feet above the level of your heart. Breathe deeply and steadily. Wait until all of the symptoms have passed.   Drink enough fluid to keep your urine clear or pale yellow.   If you are taking blood pressure or heart medicine, get up slowly and take several minutes to sit and then stand. This can reduce dizziness.   Take over-the-counter and prescription medicines only as told by your health care provider.  Get help right away if:   You have a severe headache.   You have unusual pain in your chest, abdomen, or back.   You are bleeding from your mouth or rectum, or you have black or tarry stool.   You have a very fast or irregular heartbeat (palpitations).   You faint once or repeatedly.   You have a seizure.   You are confused.   You have trouble walking.   You have  severe weakness.   You have vision problems.  These symptoms may represent a serious problem that is an emergency. Do not wait to see if your symptoms will go away. Get medical help right away. Call your local emergency services (911 in the [place] Do not drive yourself to the hospital.  This information is not intended to replace advice given to you by your health care provider. Make sure you discuss any questions you have with your health care provider.  Document Released: 12/23/2004 Document Revised: 02/05/2016 Document Reviewed: 09/06/2014  Elsevier Interactive Patient Education  2019 Elsevier Inc.

## 2018-01-03 NOTE — Consult Note (Signed)
Cardiology Consultation:   Patient ID: LANSING SIGMON MRN: [id number]; DOB: Aug 06, 1930  Admit date: 01/02/2018 Date of Consult: 01/03/2018  Primary Care Provider: Gracelyn Nurse, MD Primary Cardiologist: No primary care provider on file.  Primary Electrophysiologist:  None    Patient Profile:   Brett Johnston is a 84 y.o. male with a hx of hypertension, hyperlipidemia who is being seen today for the evaluation of syncope, PVCs at the request of Oralia Manis.  History of Present Illness:   Mr. Gramm is an 84 year old male with a history of hypertension and hyperlipidemia.  He presented to the emergency room after an episode of syncope.  He was sitting in his chair watching TV.  He does not recall a syncopal event.  His wife heard him get up and then fall to the ground.  When she went to speak with him he did not respond to her the first time she called his name.  He then open his eyes and started to wake up.  No prior cardiac history.  Work-up in the emergency room was unrevealing other than PVCs.  Past Medical History:  Diagnosis Date  . Angioedema   . BPH (benign prostatic hyperplasia)   . Diverticulosis   . HLD (hyperlipidemia)   . HTN (hypertension)     Past Surgical History:  Procedure Laterality Date  . HERNIA REPAIR    . JOINT REPLACEMENT       Home Medications:  Prior to Admission medications   Medication Sig Start Date End Date Taking? Authorizing Provider  allopurinol (ZYLOPRIM) 300 MG tablet Take 300 mg by mouth daily.   Yes [provider]  amLODipine (NORVASC) 5 MG tablet Take 5 mg by mouth daily.   Yes [provider]  aspirin EC 325 MG tablet Take 325 mg by mouth daily.   Yes [provider]  cephALEXin (KEFLEX) 500 MG capsule Take 500 mg by mouth 3 (three) times daily. 12/31/17 01/10/18 Yes [provider]  hydrochlorothiazide (HYDRODIURIL) 25 MG tablet Take 25 mg by mouth daily.   Yes [provider]  magnesium  oxide (MAG-OX) 400 MG tablet Take 400 mg by mouth daily.   Yes [provider]  potassium chloride (K-DUR,KLOR-CON) 10 MEQ tablet Take 10 mEq by mouth daily.   Yes [provider]  simvastatin (ZOCOR) 20 MG tablet Take 20 mg by mouth daily.   Yes [provider]  tamsulosin (FLOMAX) 0.4 MG CAPS capsule Take 0.4 mg by mouth daily. 04/02/17  Yes [provider]  meloxicam (MOBIC) 7.5 MG tablet Take 1 tablet (7.5 mg total) by mouth daily. Patient not taking: Reported on 01/02/2018 08/02/14   Joni Reining, PA-C    Inpatient Medications: Scheduled Meds: . aspirin EC  325 mg Oral Daily  . enoxaparin (LOVENOX) injection  40 mg Subcutaneous Q24H  . [START ON 01/04/2018] pneumococcal 23 valent vaccine  0.5 mL Intramuscular Tomorrow-1000  . simvastatin  20 mg Oral Daily  . sodium chloride flush  3 mL Intravenous Q12H   Continuous Infusions: . 0.9 % NaCl with KCl 20 mEq / L 50 mL/hr at 01/03/18 1225   PRN Meds: acetaminophen **OR** acetaminophen, ondansetron **OR** ondansetron (ZOFRAN) IV  Allergies:   No Known Allergies  Social History:   Social History   Socioeconomic History  . Marital status: Married    Spouse name: Not on file  . Number of children: Not on file  . Years of education: Not on file  .  Highest education level: Not on file  Occupational History  . Not on file  Social Needs  . Financial resource strain: Not on file  . Food insecurity:    Worry: Not on file    Inability: Not on file  . Transportation needs:    Medical: Not on file    Non-medical: Not on file  Tobacco Use  . Smoking status: Former Games developermoker  . Smokeless tobacco: Never Used  Substance and Sexual Activity  . Alcohol use: No  . Drug use: No  . Sexual activity: Not on file  Lifestyle  . Physical activity:    Days per week: Not on file    Minutes per session: Not on file  . Stress: Not on file  Relationships  . Social connections:    Talks on phone: Not on file      Gets together: Not on file    Attends religious service: Not on file    Active member of club or organization: Not on file    Attends meetings of clubs or organizations: Not on file    Relationship status: Not on file  . Intimate partner violence:    Fear of current or ex partner: Not on file    Emotionally abused: Not on file    Physically abused: Not on file    Forced sexual activity: Not on file  Other Topics Concern  . Not on file  Social History Narrative  . Not on file    Family History:    Family History  Problem Relation Age of Onset  . Breast cancer Sister   . Colon cancer Brother      ROS:  Please see the history of present illness.   All other ROS reviewed and negative.     Physical Exam/Data:   Vitals:   01/03/18 0743 01/03/18 0933 01/03/18 0935 01/03/18 0936  BP: 99/64 96/62 98/80  (!) 103/58  Pulse: 84 71 91 95  Resp: 18     Temp: 100 F (37.8 C)     TempSrc: Oral     SpO2: 95%     Weight:      Height:        Intake/Output Summary (Last 24 hours) at 01/03/2018 1231 Last data filed at 01/03/2018 0738 Gross per 24 hour  Intake 50 ml  Output 50 ml  Net 0 ml   Filed Weights   01/02/18 1953 01/03/18 0137  Weight: 59.9 kg 75.1 kg   Body mass index is 25.94 kg/m.  General:  Well nourished, well developed, in no acute distress HEENT: normal Lymph: no adenopathy Neck: no JVD Endocrine:  No thryomegaly Vascular: No carotid bruits; FA pulses 2+ bilaterally without bruits  Cardiac:  normal S1, S2; RRR; no murmur  Lungs:  clear to auscultation bilaterally, no wheezing, rhonchi or rales  Abd: soft, nontender, no hepatomegaly  Ext: no edema Musculoskeletal:  No deformities, BUE and BLE strength normal and equal Skin: warm and dry  Neuro:  CNs 2-12 intact, no focal abnormalities noted Psych:  Normal affect   EKG:  The EKG was personally reviewed and demonstrates: Sinus rhythm Telemetry:  Telemetry was personally reviewed and demonstrates: Sinus  rhythm with PACs and PVCs.  Relevant CV Studies: TTE pending  Laboratory Data:  Chemistry Recent Labs  Lab 01/02/18 1959 01/03/18 0404  NA 140 136  K 3.3* 3.0*  CL 106 104  CO2 26 22  GLUCOSE 124* 99  BUN 21 26*  CREATININE 0.88 1.13  CALCIUM 8.0* 7.9*  GFRNONAA >60 59*  GFRAA >60 >60  ANIONGAP 8 10    No results for input(s): PROT, ALBUMIN, AST, ALT, ALKPHOS, BILITOT in the last 168 hours. Hematology Recent Labs  Lab 01/02/18 1959 01/03/18 0404  WBC 5.2 7.2  RBC 4.63 4.24  HGB 14.1 13.0  HCT 41.8 38.1*  MCV 90.3 89.9  MCH 30.5 30.7  MCHC 33.7 34.1  RDW 14.2 14.2  PLT 215 183   Cardiac Enzymes Recent Labs  Lab 01/02/18 1959  TROPONINI <0.03   No results for input(s): TROPIPOC in the last 168 hours.  BNPNo results for input(s): BNP, PROBNP in the last 168 hours.  DDimer No results for input(s): DDIMER in the last 168 hours.  Radiology/Studies:  Dg Chest Port 1 View  Result Date: 01/02/2018 CLINICAL DATA:  Syncope EXAM: PORTABLE CHEST 1 VIEW COMPARISON:  February 21, 2013 FINDINGS: The heart size and mediastinal contours are within normal limits. Both lungs are clear. The visualized skeletal structures are stable. IMPRESSION: No active cardiopulmonary disease. Electronically Signed   By: Sherian ReinWei-Chen  Lin M.D.   On: 01/02/2018 21:26    Assessment and Plan:   1. Syncope: At this point it is difficult to tell what his cause of syncope is.  Unfortunately no EKG was done in the emergency room.  Current EKG shows sinus rhythm with no causes for obvious syncope.  Yuliana Vandrunen order an echocardiogram to see if he has any structural heart disease.  Telemetry shows no obvious cause of syncope.  He could have simply been orthostatic after standing up.  He did get orthostatics when getting to the floor today which were negative, but there is certainly a chance that he got fluids in the emergency room.   2. PVCs: Based on telemetry, he is not having a high volume of PVCs.  We Waris Rodger  continue telemetry. 3. Hypertension: Well-controlled.  Plan per primary team      For questions or updates, please contact [facility] Please consult [email or web address] for contact info under     Signed, Jaclyn Andy Jorja LoaMartin Jamaris Biernat, MD  01/03/2018 12:31 PM

## 2018-01-03 NOTE — Care Management Obs Status (Signed)
MEDICARE OBSERVATION STATUS NOTIFICATION   Patient Details  Name: Brett Johnston MRN: [id number] Date of Birth: [birth year]   Medicare Observation Status Notification Given:  Yes    Niranjan Rufener A Lavinia Mcneely, RN 01/03/2018, 10:17 AM

## 2018-01-07 DIAGNOSIS — R55 Syncope and collapse: Secondary | ICD-10-CM | POA: Diagnosis not present

## 2018-01-07 DIAGNOSIS — A084 Viral intestinal infection, unspecified: Secondary | ICD-10-CM | POA: Diagnosis not present

## 2018-01-07 DIAGNOSIS — Z09 Encounter for follow-up examination after completed treatment for conditions other than malignant neoplasm: Secondary | ICD-10-CM | POA: Diagnosis not present

## 2018-01-14 DIAGNOSIS — H903 Sensorineural hearing loss, bilateral: Secondary | ICD-10-CM | POA: Diagnosis not present

## 2018-03-30 DIAGNOSIS — E119 Type 2 diabetes mellitus without complications: Secondary | ICD-10-CM | POA: Diagnosis not present

## 2018-04-06 DIAGNOSIS — I10 Essential (primary) hypertension: Secondary | ICD-10-CM | POA: Diagnosis not present

## 2018-04-06 DIAGNOSIS — E7800 Pure hypercholesterolemia, unspecified: Secondary | ICD-10-CM | POA: Diagnosis not present

## 2018-04-06 DIAGNOSIS — Z0001 Encounter for general adult medical examination with abnormal findings: Secondary | ICD-10-CM | POA: Diagnosis not present

## 2018-04-06 DIAGNOSIS — Z0000 Encounter for general adult medical examination without abnormal findings: Secondary | ICD-10-CM | POA: Diagnosis not present

## 2018-04-06 DIAGNOSIS — Z8739 Personal history of other diseases of the musculoskeletal system and connective tissue: Secondary | ICD-10-CM | POA: Diagnosis not present

## 2018-04-06 DIAGNOSIS — E119 Type 2 diabetes mellitus without complications: Secondary | ICD-10-CM | POA: Diagnosis not present

## 2018-07-27 DIAGNOSIS — E119 Type 2 diabetes mellitus without complications: Secondary | ICD-10-CM | POA: Diagnosis not present

## 2018-08-03 DIAGNOSIS — E119 Type 2 diabetes mellitus without complications: Secondary | ICD-10-CM | POA: Diagnosis not present

## 2018-08-03 DIAGNOSIS — I10 Essential (primary) hypertension: Secondary | ICD-10-CM | POA: Diagnosis not present

## 2018-08-03 DIAGNOSIS — E7800 Pure hypercholesterolemia, unspecified: Secondary | ICD-10-CM | POA: Diagnosis not present

## 2018-08-03 DIAGNOSIS — Z8739 Personal history of other diseases of the musculoskeletal system and connective tissue: Secondary | ICD-10-CM | POA: Diagnosis not present

## 2018-08-13 DIAGNOSIS — H2511 Age-related nuclear cataract, right eye: Secondary | ICD-10-CM | POA: Diagnosis not present

## 2018-11-26 DIAGNOSIS — E119 Type 2 diabetes mellitus without complications: Secondary | ICD-10-CM | POA: Diagnosis not present

## 2018-12-01 DIAGNOSIS — E119 Type 2 diabetes mellitus without complications: Secondary | ICD-10-CM | POA: Diagnosis not present

## 2018-12-01 DIAGNOSIS — Z87891 Personal history of nicotine dependence: Secondary | ICD-10-CM | POA: Diagnosis not present

## 2018-12-01 DIAGNOSIS — E7800 Pure hypercholesterolemia, unspecified: Secondary | ICD-10-CM | POA: Diagnosis not present

## 2018-12-01 DIAGNOSIS — M109 Gout, unspecified: Secondary | ICD-10-CM | POA: Diagnosis not present

## 2018-12-01 DIAGNOSIS — I10 Essential (primary) hypertension: Secondary | ICD-10-CM | POA: Diagnosis not present

## 2019-03-31 DIAGNOSIS — I10 Essential (primary) hypertension: Secondary | ICD-10-CM | POA: Diagnosis not present

## 2019-03-31 DIAGNOSIS — E119 Type 2 diabetes mellitus without complications: Secondary | ICD-10-CM | POA: Diagnosis not present

## 2019-03-31 DIAGNOSIS — E7800 Pure hypercholesterolemia, unspecified: Secondary | ICD-10-CM | POA: Diagnosis not present

## 2019-04-07 DIAGNOSIS — Z1331 Encounter for screening for depression: Secondary | ICD-10-CM | POA: Diagnosis not present

## 2019-04-07 DIAGNOSIS — Z87891 Personal history of nicotine dependence: Secondary | ICD-10-CM | POA: Diagnosis not present

## 2019-04-07 DIAGNOSIS — M109 Gout, unspecified: Secondary | ICD-10-CM | POA: Diagnosis not present

## 2019-04-07 DIAGNOSIS — Z0000 Encounter for general adult medical examination without abnormal findings: Secondary | ICD-10-CM | POA: Diagnosis not present

## 2019-04-07 DIAGNOSIS — E119 Type 2 diabetes mellitus without complications: Secondary | ICD-10-CM | POA: Diagnosis not present

## 2019-04-07 DIAGNOSIS — E7800 Pure hypercholesterolemia, unspecified: Secondary | ICD-10-CM | POA: Diagnosis not present

## 2019-04-07 DIAGNOSIS — I10 Essential (primary) hypertension: Secondary | ICD-10-CM | POA: Diagnosis not present

## 2019-08-16 DIAGNOSIS — Z8739 Personal history of other diseases of the musculoskeletal system and connective tissue: Secondary | ICD-10-CM | POA: Diagnosis not present

## 2019-08-16 DIAGNOSIS — E119 Type 2 diabetes mellitus without complications: Secondary | ICD-10-CM | POA: Diagnosis not present

## 2019-08-16 DIAGNOSIS — E7800 Pure hypercholesterolemia, unspecified: Secondary | ICD-10-CM | POA: Diagnosis not present

## 2019-08-16 DIAGNOSIS — I10 Essential (primary) hypertension: Secondary | ICD-10-CM | POA: Diagnosis not present

## 2019-08-19 DIAGNOSIS — H2513 Age-related nuclear cataract, bilateral: Secondary | ICD-10-CM | POA: Diagnosis not present

## 2019-09-30 IMAGING — DX DG CHEST 1V PORT
1 series · 1 of 1 positions shown · non-contrast
Comparison: February 21, 2013

CLINICAL DATA: Syncope

EXAM:
PORTABLE CHEST 1 VIEW

[chest ap]
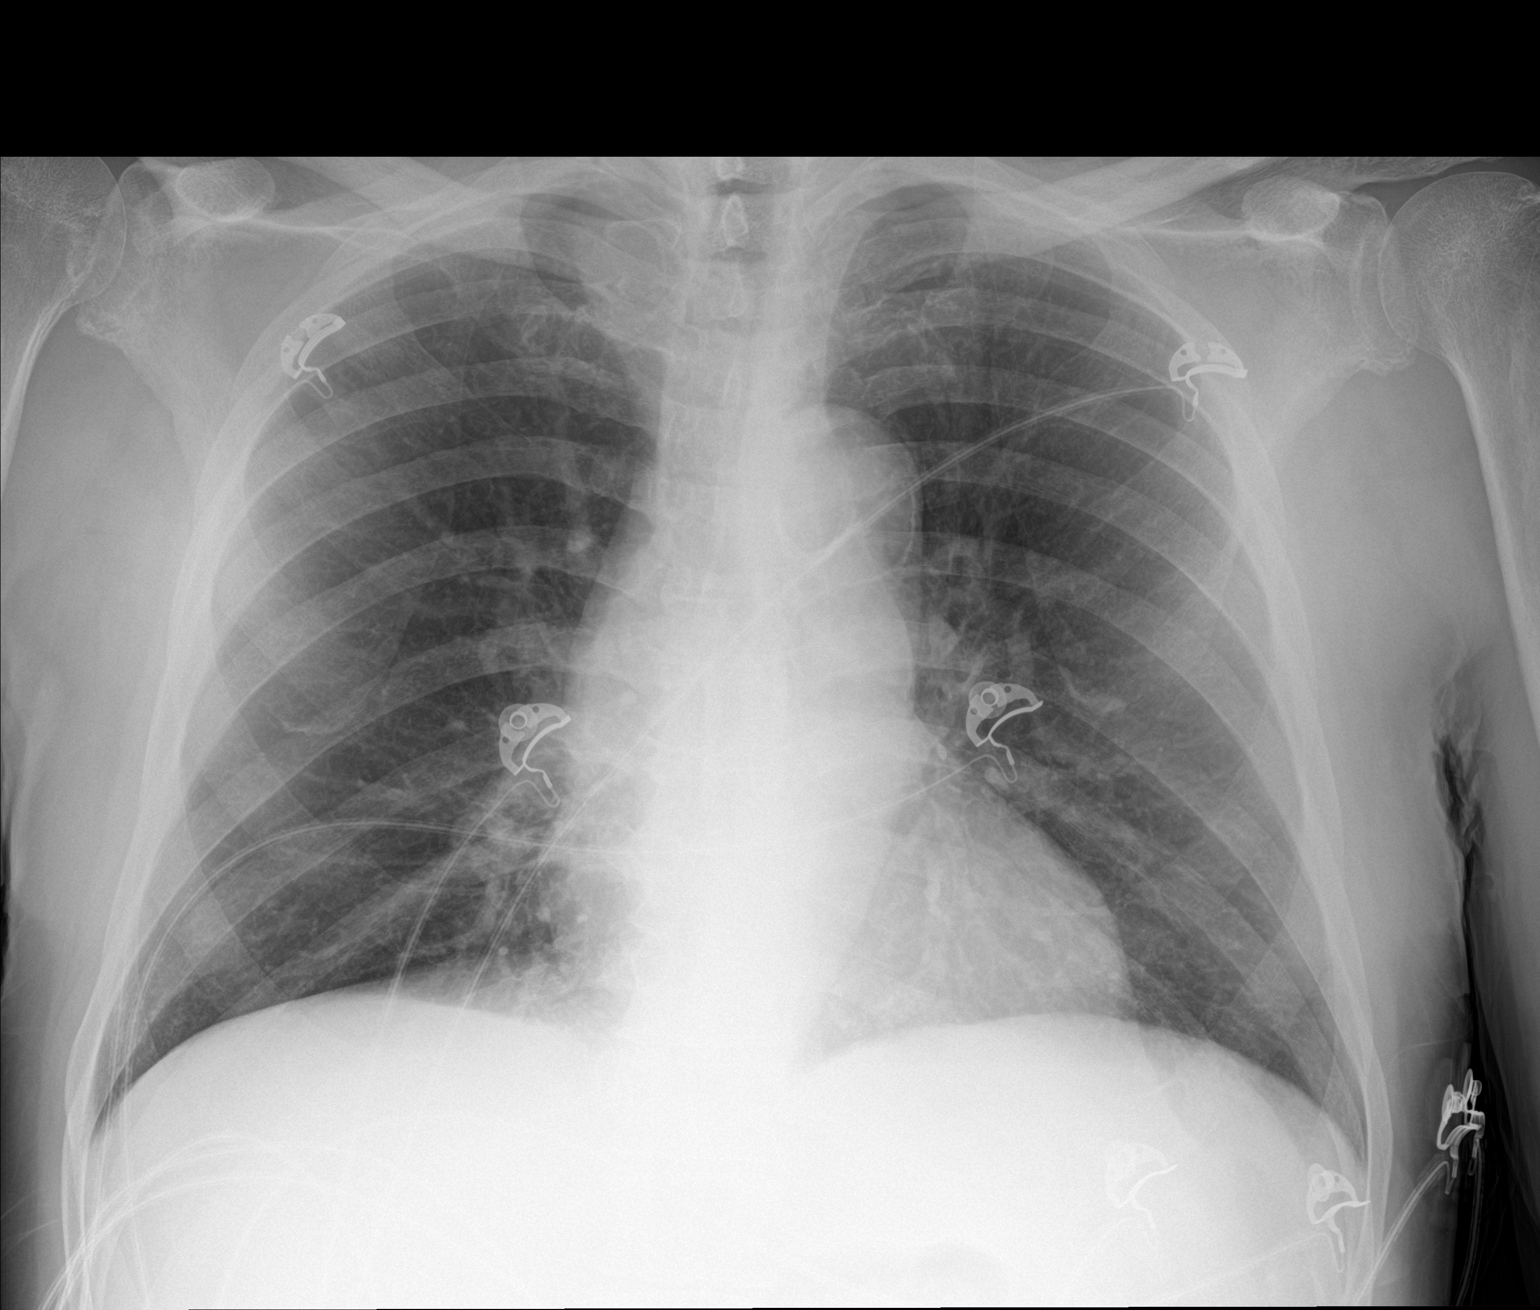

[1 of 1 positions shown; findings below may reference images not displayed]

FINDINGS: The heart size and mediastinal contours are within normal limits.
Both lungs are clear. The visualized skeletal structures are stable.
IMPRESSION: No active cardiopulmonary disease.

## 2019-12-09 DIAGNOSIS — I10 Essential (primary) hypertension: Secondary | ICD-10-CM | POA: Diagnosis not present

## 2019-12-09 DIAGNOSIS — E119 Type 2 diabetes mellitus without complications: Secondary | ICD-10-CM | POA: Diagnosis not present

## 2019-12-16 DIAGNOSIS — I10 Essential (primary) hypertension: Secondary | ICD-10-CM | POA: Diagnosis not present

## 2019-12-16 DIAGNOSIS — E119 Type 2 diabetes mellitus without complications: Secondary | ICD-10-CM | POA: Diagnosis not present

## 2019-12-16 DIAGNOSIS — M109 Gout, unspecified: Secondary | ICD-10-CM | POA: Diagnosis not present

## 2019-12-16 DIAGNOSIS — E7800 Pure hypercholesterolemia, unspecified: Secondary | ICD-10-CM | POA: Diagnosis not present

## 2020-09-03 ENCOUNTER — Encounter: Payer: Self-pay | Admitting: Oncology

## 2020-09-03 ENCOUNTER — Inpatient Hospital Stay: Payer: Medicare Other

## 2020-09-03 ENCOUNTER — Inpatient Hospital Stay: Payer: Medicare Other | Attending: Oncology | Admitting: Oncology

## 2020-09-03 VITALS — BP 144/72 | HR 55 | Temp 97.5°F | Resp 18 | Ht 67.0 in | Wt 166.3 lb

## 2020-09-03 DIAGNOSIS — Z803 Family history of malignant neoplasm of breast: Secondary | ICD-10-CM | POA: Insufficient documentation

## 2020-09-03 DIAGNOSIS — Z87891 Personal history of nicotine dependence: Secondary | ICD-10-CM | POA: Diagnosis not present

## 2020-09-03 DIAGNOSIS — D72818 Other decreased white blood cell count: Secondary | ICD-10-CM

## 2020-09-03 DIAGNOSIS — D649 Anemia, unspecified: Secondary | ICD-10-CM | POA: Diagnosis not present

## 2020-09-03 DIAGNOSIS — Z800 Family history of malignant neoplasm of digestive organs: Secondary | ICD-10-CM | POA: Diagnosis not present

## 2020-09-03 DIAGNOSIS — D72819 Decreased white blood cell count, unspecified: Secondary | ICD-10-CM | POA: Insufficient documentation

## 2020-09-03 DIAGNOSIS — Z79899 Other long term (current) drug therapy: Secondary | ICD-10-CM | POA: Insufficient documentation

## 2020-09-03 LAB — CBC WITH DIFFERENTIAL/PLATELET
Abs Immature Granulocytes: 0 10*3/uL (ref 0.00–0.07)
Basophils Absolute: 0 10*3/uL (ref 0.0–0.1)
Basophils Relative: 1 %
Eosinophils Absolute: 0.1 10*3/uL (ref 0.0–0.5)
Eosinophils Relative: 4 %
HCT: 36.7 % — ABNORMAL LOW (ref 39.0–52.0)
Hemoglobin: 12.1 g/dL — ABNORMAL LOW (ref 13.0–17.0)
Immature Granulocytes: 0 %
Lymphocytes Relative: 27 %
Lymphs Abs: 0.7 10*3/uL (ref 0.7–4.0)
MCH: 30.5 pg (ref 26.0–34.0)
MCHC: 33 g/dL (ref 30.0–36.0)
MCV: 92.4 fL (ref 80.0–100.0)
Monocytes Absolute: 0.2 10*3/uL (ref 0.1–1.0)
Monocytes Relative: 8 %
Neutro Abs: 1.5 10*3/uL — ABNORMAL LOW (ref 1.7–7.7)
Neutrophils Relative %: 60 %
Platelets: 154 10*3/uL (ref 150–400)
RBC: 3.97 MIL/uL — ABNORMAL LOW (ref 4.22–5.81)
RDW: 13.7 % (ref 11.5–15.5)
WBC: 2.6 10*3/uL — ABNORMAL LOW (ref 4.0–10.5)
nRBC: 0 % (ref 0.0–0.2)

## 2020-09-03 LAB — COMPREHENSIVE METABOLIC PANEL
ALT: 18 U/L (ref 0–44)
AST: 23 U/L (ref 15–41)
Albumin: 4.3 g/dL (ref 3.5–5.0)
Alkaline Phosphatase: 107 U/L (ref 38–126)
Anion gap: 7 (ref 5–15)
BUN: 15 mg/dL (ref 8–23)
CO2: 29 mmol/L (ref 22–32)
Calcium: 8.5 mg/dL — ABNORMAL LOW (ref 8.9–10.3)
Chloride: 102 mmol/L (ref 98–111)
Creatinine, Ser: 0.75 mg/dL (ref 0.61–1.24)
GFR, Estimated: >60 mL/min (ref >60)
Glucose, Bld: 88 mg/dL (ref 70–99)
Potassium: 3.8 mmol/L (ref 3.5–5.1)
Sodium: 138 mmol/L (ref 135–145)
Total Bilirubin: 0.5 mg/dL (ref 0.3–1.2)
Total Protein: 7.2 g/dL (ref 6.5–8.1)

## 2020-09-03 LAB — HIV ANTIBODY (ROUTINE TESTING W REFLEX): HIV Screen 4th Generation wRfx: NONREACTIVE

## 2020-09-03 LAB — LACTATE DEHYDROGENASE: LDH: 181 U/L (ref 98–192)

## 2020-09-03 LAB — HEPATITIS PANEL, ACUTE
HCV Ab: NONREACTIVE
Hep A IgM: NONREACTIVE
Hep B C IgM: NONREACTIVE
Hepatitis B Surface Ag: NONREACTIVE

## 2020-09-03 LAB — FOLATE: Folate: 43 ng/mL (ref >5.9)

## 2020-09-03 LAB — TECHNOLOGIST SMEAR REVIEW
Plt Morphology: NORMAL
RBC MORPHOLOGY: NORMAL
WBC MORPHOLOGY: NORMAL

## 2020-09-03 LAB — VITAMIN B12: Vitamin B-12: 355 pg/mL (ref 180–914)

## 2020-09-03 LAB — TSH: TSH: 1.354 u[IU]/mL (ref 0.350–4.500)

## 2020-09-03 NOTE — Progress Notes (Signed)
Pt here to establish care.

## 2020-09-03 NOTE — Progress Notes (Signed)
Hematology/Oncology Consult note [facility] Telephone:([phone] [phone]   Patient Care Team: Gracelyn Nurse, MD as PCP - General (Internal Medicine)  REFERRING PROVIDER: Gracelyn Nurse, MD  CHIEF COMPLAINTS/REASON FOR VISIT:  Evaluation of leukopenia  HISTORY OF PRESENTING ILLNESS:  Brett Johnston is a 87 y.o. male who was seen in consultation at the request of Gracelyn Nurse, MD for evaluation of leukopenia. Reviewed patient's recent labs. 08/15/2020 Patient has low total WBC count was 2.8, predominantly lymphocytopenia 0.83. Previous lab records reviewed. Leukopenia duration is chronic onset, duration is intermittently since 2017 No aggravating or improving factors.  Associated symptoms:  denies fatigue, weight loss, fever, chills, frequent infection.  History hepatitis or HIV infection: Denies History of chronic liver disease: Denies History of blood transfusion: Denies Alcohol consumption: Denies Diet Vegetarian or Vegan: Denies Herbal medication: Denies Denies joint pain or stiffness  Review of Systems  Constitutional:  Negative for appetite change, chills, diaphoresis, fatigue, fever and unexpected weight change.  HENT:   Negative for hearing loss, lump/mass, nosebleeds, sore throat and voice change.   Eyes:  Negative for eye problems and icterus.  Respiratory:  Negative for chest tightness, cough, hemoptysis, shortness of breath and wheezing.   Cardiovascular:  Negative for chest pain and leg swelling.  Gastrointestinal:  Negative for abdominal distention, abdominal pain, blood in stool, diarrhea, nausea and rectal pain.  Endocrine: Negative for hot flashes.  Genitourinary:  Negative for bladder incontinence, difficulty urinating, dysuria, frequency, hematuria and nocturia.   Musculoskeletal:  Negative for arthralgias, back pain, flank pain, gait problem and myalgias.  Skin:  Negative for itching and rash.  Neurological:   Negative for dizziness, gait problem, headaches, light-headedness, numbness and seizures.  Hematological:  Negative for adenopathy. Does not bruise/bleed easily.  Psychiatric/Behavioral:  Negative for confusion and decreased concentration. The patient is not nervous/anxious.    MEDICAL HISTORY:  Past Medical History:  Diagnosis Date   Angioedema    BPH (benign prostatic hyperplasia)    Diverticulosis    HLD (hyperlipidemia)    HTN (hypertension)     SURGICAL HISTORY: Past Surgical History:  Procedure Laterality Date   HERNIA REPAIR     JOINT REPLACEMENT      SOCIAL HISTORY: Social History   Socioeconomic History   Marital status: Married    Spouse name: Not on file   Number of children: Not on file   Years of education: Not on file   Highest education level: Not on file  Occupational History   Not on file  Tobacco Use   Smoking status: Former    Years: 20.00    Types: Cigarettes    Quit date: 83    Years since quitting: 25.6   Smokeless tobacco: Never  Vaping Use   Vaping Use: Never used  Substance and Sexual Activity   Alcohol use: No   Drug use: No   Sexual activity: Not on file  Other Topics Concern   Not on file  Social History Narrative   Not on file   Social Determinants of Health   Financial Resource Strain: Not on file  Food Insecurity: Not on file  Transportation Needs: Not on file  Physical Activity: Not on file  Stress: Not on file  Social Connections: Not on file  Intimate Partner Violence: Not on file    FAMILY HISTORY: Family History  Problem Relation Age of Onset   Breast cancer Sister    Colon cancer Brother  ALLERGIES:  has No Known Allergies.  MEDICATIONS:  Current Outpatient Medications  Medication Sig Dispense Refill   allopurinol (ZYLOPRIM) 300 MG tablet Take 300 mg by mouth daily.     aspirin EC 325 MG tablet Take 325 mg by mouth daily.     magnesium oxide (MAG-OX) 400 MG tablet Take 400 mg by mouth daily.      potassium chloride (K-DUR,KLOR-CON) 10 MEQ tablet Take 10 mEq by mouth daily.     simvastatin (ZOCOR) 20 MG tablet Take 20 mg by mouth daily.     tamsulosin (FLOMAX) 0.4 MG CAPS capsule Take 0.4 mg by mouth daily.     No current facility-administered medications for this visit.     PHYSICAL EXAMINATION: ECOG PERFORMANCE STATUS: 0 - Asymptomatic Vitals:   09/03/20 1057  BP: (!) 144/72  Pulse: (!) 55  Resp: 18  Temp: (!) 97.5 F (36.4 C)   Filed Weights   09/03/20 1057  Weight: 166 lb 4.8 oz (75.4 kg)    Physical Exam Constitutional:      General: He is not in acute distress. HENT:     Head: Normocephalic and atraumatic.  Eyes:     General: No scleral icterus. Cardiovascular:     Rate and Rhythm: Normal rate and regular rhythm.     Heart sounds: Normal heart sounds.  Pulmonary:     Effort: Pulmonary effort is normal. No respiratory distress.     Breath sounds: No wheezing.  Abdominal:     General: Bowel sounds are normal. There is no distension.     Palpations: Abdomen is soft.  Musculoskeletal:        General: No deformity. Normal range of motion.     Cervical back: Normal range of motion and neck supple.  Skin:    General: Skin is warm and dry.     Findings: No erythema or rash.  Neurological:     Mental Status: He is alert and oriented to person, place, and time. Mental status is at baseline.     Cranial Nerves: No cranial nerve deficit.     Coordination: Coordination normal.  Psychiatric:        Mood and Affect: Mood normal.    RADIOGRAPHIC STUDIES: I have personally reviewed the radiological images as listed and agreed with the findings in the report.  No results found.  Labs CBC Latest Ref Rng & Units 09/03/2020  WBC 4.0 - 10.5 K/uL 2.6(L)  Hemoglobin 13.0 - 17.0 g/dL 12.1(L)  Hematocrit 39.0 - 52.0 % 36.7(L)  Platelets 150 - 400 K/uL 154    LABORATORY DATA:  I have reviewed the data as listed Lab Results  Component Value Date   WBC 2.6 (L)  09/03/2020   HGB 12.1 (L) 09/03/2020   HCT 36.7 (L) 09/03/2020   MCV 92.4 09/03/2020   PLT 154 09/03/2020   Recent Labs    09/03/20 1141  NA 138  K 3.8  CL 102  CO2 29  GLUCOSE 88  BUN 15  CREATININE 0.75  CALCIUM 8.5*  GFRNONAA >60  PROT 7.2  ALBUMIN 4.3  AST 23  ALT 18  ALKPHOS 107  BILITOT 0.5   Iron/TIBC/Ferritin/ %Sat No results found for: IRON, TIBC, FERRITIN, IRONPCTSAT   RADIOGRAPHIC STUDIES: I have personally reviewed the radiological images as listed and agreed with the findings in the report. No results found.    ASSESSMENT & PLAN:  1. Other decreased white blood cell (WBC) count   2. Normocytic anemia  I discussed with patient that the differential diagnosis of the leukopenia  is broad, including infection, inflammation, nutrition deficiency, ethnic leukopenia, malignant etiology including underlying bone morrow disorders.  For the work up of patient's thrombocytopenia, I recommend checking CBC;CMP, LDH; smear review, folate, Vitamin B12, hepatitis, HIV,  flowcytometry and monoclonal gammopathy workup.   # Patient follow-up with me in approximately 2 weeks to review the above results.   Orders Placed This Encounter  Procedures   CBC with Differential/Platelet    Standing Status:   Future    Number of Occurrences:   1    Standing Expiration Date:   09/03/2021   Comprehensive metabolic panel    Standing Status:   Future    Number of Occurrences:   1    Standing Expiration Date:   09/03/2021   Vitamin B12    Standing Status:   Future    Number of Occurrences:   1    Standing Expiration Date:   09/03/2021   Folate    Standing Status:   Future    Number of Occurrences:   1    Standing Expiration Date:   09/03/2021   Lactate dehydrogenase    Standing Status:   Future    Number of Occurrences:   1    Standing Expiration Date:   09/03/2021   Protein electrophoresis, serum    Standing Status:   Future    Number of Occurrences:   1    Standing  Expiration Date:   09/03/2021   HIV Antibody (routine testing w rflx)    Standing Status:   Future    Number of Occurrences:   1    Standing Expiration Date:   09/03/2021   Technologist smear review    Standing Status:   Future    Number of Occurrences:   1    Standing Expiration Date:   09/03/2021   Flow cytometry panel-leukemia/lymphoma work-up    Standing Status:   Future    Number of Occurrences:   1    Standing Expiration Date:   09/03/2021   TSH    Standing Status:   Future    Number of Occurrences:   1    Standing Expiration Date:   09/03/2021   Hepatitis panel, acute    Standing Status:   Future    Number of Occurrences:   1    Standing Expiration Date:   09/03/2021    All questions were answered. The patient knows to call the clinic with any problems questions or concerns.  Cc Gracelyn Nurse, MD   Thank you for this kind referral and the opportunity to participate in the care of this patient. A copy of today's note is routed to referring provider      Rickard Patience, MD, PhD Hematology Oncology [facility] at [facility] Pager- [phone] 09/03/2020

## 2020-09-05 LAB — PROTEIN ELECTROPHORESIS, SERUM
A/G Ratio: 1.6 (ref 0.7–1.7)
Albumin ELP: 4 g/dL (ref 2.9–4.4)
Alpha-1-Globulin: 0.2 g/dL (ref 0.0–0.4)
Alpha-2-Globulin: 0.5 g/dL (ref 0.4–1.0)
Beta Globulin: 0.9 g/dL (ref 0.7–1.3)
Gamma Globulin: 0.9 g/dL (ref 0.4–1.8)
Globulin, Total: 2.5 g/dL (ref 2.2–3.9)
Total Protein ELP: 6.5 g/dL (ref 6.0–8.5)

## 2020-09-05 LAB — COMP PANEL: LEUKEMIA/LYMPHOMA

## 2020-09-17 ENCOUNTER — Inpatient Hospital Stay: Payer: Medicare Other | Attending: Oncology | Admitting: Oncology

## 2020-09-17 ENCOUNTER — Encounter: Payer: Self-pay | Admitting: Oncology

## 2020-09-17 VITALS — BP 142/76 | HR 58 | Temp 96.2°F | Resp 20 | Wt 167.7 lb

## 2020-09-17 DIAGNOSIS — Z79899 Other long term (current) drug therapy: Secondary | ICD-10-CM | POA: Insufficient documentation

## 2020-09-17 DIAGNOSIS — D72819 Decreased white blood cell count, unspecified: Secondary | ICD-10-CM | POA: Diagnosis not present

## 2020-09-17 DIAGNOSIS — Z803 Family history of malignant neoplasm of breast: Secondary | ICD-10-CM | POA: Diagnosis not present

## 2020-09-17 DIAGNOSIS — Z800 Family history of malignant neoplasm of digestive organs: Secondary | ICD-10-CM | POA: Diagnosis not present

## 2020-09-17 DIAGNOSIS — D649 Anemia, unspecified: Secondary | ICD-10-CM | POA: Diagnosis not present

## 2020-09-17 DIAGNOSIS — Z87891 Personal history of nicotine dependence: Secondary | ICD-10-CM | POA: Insufficient documentation

## 2020-09-17 DIAGNOSIS — D708 Other neutropenia: Secondary | ICD-10-CM | POA: Diagnosis not present

## 2020-09-17 MED ORDER — VITAMIN B-12 1000 MCG PO TABS: 1000.0000 ug | ORAL_TABLET | Freq: Every day | ORAL | 0 refills | Status: AC

## 2020-09-17 MED ORDER — CALCIUM CARBONATE-VITAMIN D 500-200 MG-UNIT PO TABS: 1.0000 | ORAL_TABLET | Freq: Every day | ORAL | 0 refills | Status: AC

## 2020-09-17 NOTE — Progress Notes (Addendum)
Hematology/Oncology Consult note [facility] [phone] [phone]   Patient Care Team: Gracelyn Nurse, MD as PCP - General (Internal Medicine)  REFERRING PROVIDER: Gracelyn Nurse, MD  CHIEF COMPLAINTS/REASON FOR VISIT:  Follow up leukopenia  HISTORY OF PRESENTING ILLNESS:  Brett Johnston is a 87 y.o. male who was seen in consultation at the request of Gracelyn Nurse, MD for evaluation of leukopenia. Reviewed patient's recent labs. 08/15/2020 Patient has low total WBC count was 2.8, predominantly lymphocytopenia 0.83. Previous lab records reviewed. Leukopenia duration is chronic onset, duration is intermittently since 2017 No aggravating or improving factors.  Associated symptoms:  denies fatigue, weight loss, fever, chills, frequent infection.  History hepatitis or HIV infection: Denies History of chronic liver disease: Denies History of blood transfusion: Denies Alcohol consumption: Denies Diet Vegetarian or Vegan: Denies Herbal medication: Denies Denies joint pain or stiffness   INTERVAL HISTORY Brett Johnston is a 87 y.o. male who has above history reviewed by me today presents for follow up visit for neutropenia He had blood work done and presents to review work up result and plan. No new complaints.   Review of Systems  Constitutional:  Negative for appetite change, chills, diaphoresis, fatigue, fever and unexpected weight change.  HENT:   Negative for hearing loss, lump/mass, nosebleeds, sore throat and voice change.   Eyes:  Negative for eye problems and icterus.  Respiratory:  Negative for chest tightness, cough, hemoptysis, shortness of breath and wheezing.   Cardiovascular:  Negative for chest pain and leg swelling.  Gastrointestinal:  Negative for abdominal distention, abdominal pain, blood in stool, diarrhea, nausea and rectal pain.  Endocrine: Negative for hot flashes.  Genitourinary:  Negative for bladder  incontinence, difficulty urinating, dysuria, frequency, hematuria and nocturia.   Musculoskeletal:  Negative for arthralgias, back pain, flank pain, gait problem and myalgias.  Skin:  Negative for itching and rash.  Neurological:  Negative for dizziness, gait problem, headaches, light-headedness, numbness and seizures.  Hematological:  Negative for adenopathy. Does not bruise/bleed easily.  Psychiatric/Behavioral:  Negative for confusion and decreased concentration. The patient is not nervous/anxious.    MEDICAL HISTORY:  Past Medical History:  Diagnosis Date   Angioedema    BPH (benign prostatic hyperplasia)    Diverticulosis    HLD (hyperlipidemia)    HTN (hypertension)     SURGICAL HISTORY: Past Surgical History:  Procedure Laterality Date   HERNIA REPAIR     JOINT REPLACEMENT      SOCIAL HISTORY: Social History   Socioeconomic History   Marital status: Married    Spouse name: Not on file   Number of children: Not on file   Years of education: Not on file   Highest education level: Not on file  Occupational History   Not on file  Tobacco Use   Smoking status: Former    Years: 20.00    Types: Cigarettes    Quit date: 1997    Years since quitting: 25.7   Smokeless tobacco: Never  Vaping Use   Vaping Use: Never used  Substance and Sexual Activity   Alcohol use: No   Drug use: No   Sexual activity: Not on file  Other Topics Concern   Not on file  Social History Narrative   Not on file   Social Determinants of Health   Financial Resource Strain: Not on file  Food Insecurity: Not on file  Transportation Needs: Not on file  Physical Activity: Not on file  Stress: Not on file  Social Connections: Not on file  Intimate Partner Violence: Not on file    FAMILY HISTORY: Family History  Problem Relation Age of Onset   Breast cancer Sister    Colon cancer Brother     ALLERGIES:  has No Known Allergies.  MEDICATIONS:  Current Outpatient Medications   Medication Sig Dispense Refill   allopurinol (ZYLOPRIM) 300 MG tablet Take 300 mg by mouth daily.     aspirin EC 325 MG tablet Take 325 mg by mouth daily.     calcium-vitamin D (OSCAL WITH D) 500-200 MG-UNIT tablet Take 1 tablet by mouth daily. 30 tablet 0   magnesium oxide (MAG-OX) 400 MG tablet Take 400 mg by mouth daily.     potassium chloride (K-DUR,KLOR-CON) 10 MEQ tablet Take 10 mEq by mouth daily.     simvastatin (ZOCOR) 20 MG tablet Take 20 mg by mouth daily.     tamsulosin (FLOMAX) 0.4 MG CAPS capsule Take 0.4 mg by mouth daily.     vitamin B-12 (CYANOCOBALAMIN) 1000 MCG tablet Take 1 tablet (1,000 mcg total) by mouth daily. 30 tablet 0   No current facility-administered medications for this visit.     PHYSICAL EXAMINATION: ECOG PERFORMANCE STATUS: 0 - Asymptomatic Vitals:   09/17/20 1408  BP: (!) 142/76  Pulse: (!) 58  Resp: 20  Temp: (!) 96.2 F (35.7 C)  SpO2: 100%   Filed Weights   09/17/20 1408  Weight: 167 lb 11.2 oz (76.1 kg)    Physical Exam Constitutional:      General: He is not in acute distress. HENT:     Head: Normocephalic and atraumatic.  Eyes:     General: No scleral icterus. Cardiovascular:     Rate and Rhythm: Normal rate and regular rhythm.     Heart sounds: Normal heart sounds.  Pulmonary:     Effort: Pulmonary effort is normal. No respiratory distress.     Breath sounds: No wheezing.  Abdominal:     General: Bowel sounds are normal. There is no distension.     Palpations: Abdomen is soft.  Musculoskeletal:        General: No deformity. Normal range of motion.     Cervical back: Normal range of motion and neck supple.  Skin:    General: Skin is warm and dry.     Findings: No erythema or rash.  Neurological:     Mental Status: He is alert and oriented to person, place, and time. Mental status is at baseline.     Cranial Nerves: No cranial nerve deficit.     Coordination: Coordination normal.  Psychiatric:        Mood and Affect:  Mood normal.    RADIOGRAPHIC STUDIES: I have personally reviewed the radiological images as listed and agreed with the findings in the report.  No results found.  Labs CBC Latest Ref Rng & Units 09/03/2020  WBC 4.0 - 10.5 K/uL 2.6(L)  Hemoglobin 13.0 - 17.0 g/dL 12.1(L)  Hematocrit 39.0 - 52.0 % 36.7(L)  Platelets 150 - 400 K/uL 154    LABORATORY DATA:  I have reviewed the data as listed Lab Results  Component Value Date   WBC 2.6 (L) 09/03/2020   HGB 12.1 (L) 09/03/2020   HCT 36.7 (L) 09/03/2020   MCV 92.4 09/03/2020   PLT 154 09/03/2020   Recent Labs    09/03/20 1141  NA 138  K 3.8  CL 102  CO2 29  GLUCOSE 88  BUN 15  CREATININE 0.75  CALCIUM 8.5*  GFRNONAA >60  PROT 7.2  ALBUMIN 4.3  AST 23  ALT 18  ALKPHOS 107  BILITOT 0.5    Iron/TIBC/Ferritin/ %Sat No results found for: IRON, TIBC, FERRITIN, IRONPCTSAT   RADIOGRAPHIC STUDIES: I have personally reviewed the radiological images as listed and agreed with the findings in the report. No results found.    ASSESSMENT & PLAN:  1. Other neutropenia (HCC)   2. Normocytic anemia   3. Hypocalcemia    Choric leukopenia with neutropenia Labs are reviewed and discussed with patient. Negative peripheral blood flowcytometry, normal LDH, negative M protein, negative HIV/hepatitis.  Smear revealed no morphology abnormality.  No constitutional symptoms, no lymph adenopathy on exam. ANC 1.5 Recommend observation.   Vitamin B12 is normal, low end recommend him to take empiric B12 daily.- OTC Rx sent Hypocalcemia, recommend calcium supplementation-OTC Rx sent Normocytic anemia, stable hemoglobin  Orders Placed This Encounter  Procedures   CBC with Differential/Platelet    Standing Status:   Future    Standing Expiration Date:   09/17/2021   Comprehensive metabolic panel    Standing Status:   Future    Standing Expiration Date:   09/17/2021   Technologist smear review    Standing Status:   Future     Standing Expiration Date:   09/17/2021    All questions were answered. The patient knows to call the clinic with any problems questions or concerns.  Cc Gracelyn Nurse, MD  Follow up in 6 months.      Rickard Patience, MD, PhD Hematology Oncology [facility] at [facility] Pager- [phone] 09/17/2020

## 2021-03-19 ENCOUNTER — Ambulatory Visit: Payer: Medicare Other | Admitting: Oncology

## 2021-03-19 ENCOUNTER — Other Ambulatory Visit: Payer: Medicare Other

## 2021-03-26 ENCOUNTER — Inpatient Hospital Stay: Payer: Medicare Other

## 2021-03-26 ENCOUNTER — Inpatient Hospital Stay: Payer: Medicare Other | Admitting: Nurse Practitioner

## 2021-03-26 ENCOUNTER — Encounter: Payer: Self-pay | Admitting: Nurse Practitioner

## 2021-04-16 ENCOUNTER — Inpatient Hospital Stay: Payer: Medicare Other | Admitting: Nurse Practitioner

## 2021-04-16 ENCOUNTER — Encounter: Payer: Self-pay | Admitting: Nurse Practitioner

## 2021-04-16 ENCOUNTER — Inpatient Hospital Stay: Payer: Medicare Other | Attending: Nurse Practitioner

## 2021-04-16 VITALS — BP 159/60 | HR 54 | Temp 98.7°F | Resp 20 | Wt 168.4 lb

## 2021-04-16 DIAGNOSIS — D72819 Decreased white blood cell count, unspecified: Secondary | ICD-10-CM | POA: Insufficient documentation

## 2021-04-16 DIAGNOSIS — E876 Hypokalemia: Secondary | ICD-10-CM

## 2021-04-16 DIAGNOSIS — D649 Anemia, unspecified: Secondary | ICD-10-CM | POA: Insufficient documentation

## 2021-04-16 DIAGNOSIS — I10 Essential (primary) hypertension: Secondary | ICD-10-CM | POA: Diagnosis not present

## 2021-04-16 DIAGNOSIS — N400 Enlarged prostate without lower urinary tract symptoms: Secondary | ICD-10-CM | POA: Diagnosis not present

## 2021-04-16 DIAGNOSIS — Z87891 Personal history of nicotine dependence: Secondary | ICD-10-CM | POA: Insufficient documentation

## 2021-04-16 DIAGNOSIS — Z800 Family history of malignant neoplasm of digestive organs: Secondary | ICD-10-CM | POA: Diagnosis not present

## 2021-04-16 DIAGNOSIS — E785 Hyperlipidemia, unspecified: Secondary | ICD-10-CM | POA: Insufficient documentation

## 2021-04-16 DIAGNOSIS — Z803 Family history of malignant neoplasm of breast: Secondary | ICD-10-CM | POA: Insufficient documentation

## 2021-04-16 DIAGNOSIS — Z79899 Other long term (current) drug therapy: Secondary | ICD-10-CM | POA: Insufficient documentation

## 2021-04-16 DIAGNOSIS — D708 Other neutropenia: Secondary | ICD-10-CM

## 2021-04-16 LAB — CBC WITH DIFFERENTIAL/PLATELET
Abs Immature Granulocytes: 0.01 10*3/uL (ref 0.00–0.07)
Basophils Absolute: 0 10*3/uL (ref 0.0–0.1)
Basophils Relative: 1 %
Eosinophils Absolute: 0.2 10*3/uL (ref 0.0–0.5)
Eosinophils Relative: 4 %
HCT: 37.2 % — ABNORMAL LOW (ref 39.0–52.0)
Hemoglobin: 12.5 g/dL — ABNORMAL LOW (ref 13.0–17.0)
Immature Granulocytes: 0 %
Lymphocytes Relative: 23 %
Lymphs Abs: 0.8 10*3/uL (ref 0.7–4.0)
MCH: 30.9 pg (ref 26.0–34.0)
MCHC: 33.6 g/dL (ref 30.0–36.0)
MCV: 91.9 fL (ref 80.0–100.0)
Monocytes Absolute: 0.3 10*3/uL (ref 0.1–1.0)
Monocytes Relative: 10 %
Neutro Abs: 2.1 10*3/uL (ref 1.7–7.7)
Neutrophils Relative %: 62 %
Platelets: 206 10*3/uL (ref 150–400)
RBC: 4.05 MIL/uL — ABNORMAL LOW (ref 4.22–5.81)
RDW: 14.1 % (ref 11.5–15.5)
WBC: 3.5 10*3/uL — ABNORMAL LOW (ref 4.0–10.5)
nRBC: 0 % (ref 0.0–0.2)

## 2021-04-16 LAB — COMPREHENSIVE METABOLIC PANEL
ALT: 17 U/L (ref 0–44)
AST: 23 U/L (ref 15–41)
Albumin: 3.9 g/dL (ref 3.5–5.0)
Alkaline Phosphatase: 99 U/L (ref 38–126)
Anion gap: 9 (ref 5–15)
BUN: 15 mg/dL (ref 8–23)
CO2: 28 mmol/L (ref 22–32)
Calcium: 8.4 mg/dL — ABNORMAL LOW (ref 8.9–10.3)
Chloride: 101 mmol/L (ref 98–111)
Creatinine, Ser: 0.76 mg/dL (ref 0.61–1.24)
GFR, Estimated: >60 mL/min (ref >60)
Glucose, Bld: 98 mg/dL (ref 70–99)
Potassium: 3.3 mmol/L — ABNORMAL LOW (ref 3.5–5.1)
Sodium: 138 mmol/L (ref 135–145)
Total Bilirubin: 0.6 mg/dL (ref 0.3–1.2)
Total Protein: 6.7 g/dL (ref 6.5–8.1)

## 2021-04-16 LAB — TECHNOLOGIST SMEAR REVIEW: Plt Morphology: NORMAL

## 2021-04-16 NOTE — Addendum Note (Signed)
Addended by: Ashley Royalty A on: 04/16/2021 09:57 AM ? ? Modules accepted: Orders ? ?

## 2021-04-16 NOTE — Progress Notes (Signed)
?Hematology/Oncology Consult Note ?[facility] ?Telephone:([phone]) [phone] Fax:(336) [phone] ? ? ?Patient Care Team: ?Gracelyn NurseJohnston, Ricco D, MD as PCP - General (Internal Medicine) ?Brett PatienceYu, Zhou, MD as Consulting Physician (Oncology) ? ?REFERRING PROVIDER: ?Gracelyn NurseJohnston, Brett D, MD ? ?CHIEF COMPLAINTS/REASON FOR VISIT:  ?Follow up leukopenia ? ?HISTORY OF PRESENTING ILLNESS:  ?Brett Johnston is a 88 y.o. male who was seen in consultation at the request of Gracelyn NurseJohnston, Kerry D, MD for evaluation of leukopenia. ?Reviewed patient's recent labs. 08/15/2020 Patient has low total WBC count was 2.8, predominantly lymphocytopenia 0.83. ?Previous lab records reviewed. Leukopenia duration is chronic onset, duration is intermittently since 2017 ?No aggravating or improving factors. ? ?Associated symptoms:  denies fatigue, weight loss, fever, chills, frequent infection.  ?History hepatitis or HIV infection: Denies ?History of chronic liver disease: Denies ?History of blood transfusion: Denies ?Alcohol consumption: Denies ?Diet Vegetarian or Vegan: Denies ?Herbal medication: Denies ?Denies joint pain or stiffness ? ? ?INTERVAL HISTORY ?Brett [name] is a 88 y.o. male, very pleasant, with above history of neutropenia who returns to clinic for follow up. He continues to feel well and denies interval infections, night sweats, unintentional weight loss. His wife has been ill for past several months and he, along with family, has been taking of her. He continues to drive. Stays active. Says he has a great quality of life, has worked most all of his life and now enjoys his great grand children.  ? ? ?Review of Systems  ?Constitutional:  Negative for appetite change, chills, diaphoresis, fatigue, fever and unexpected weight change.  ?HENT:   Negative for hearing loss, lump/mass, nosebleeds, sore throat and voice change.   ?Eyes:  Negative for eye problems and icterus.  ?Respiratory:  Negative for chest tightness, cough, hemoptysis,  shortness of breath and wheezing.   ?Cardiovascular:  Negative for chest pain and leg swelling.  ?Gastrointestinal:  Negative for abdominal distention, abdominal pain, blood in stool, diarrhea, nausea and rectal pain.  ?Endocrine: Negative for hot flashes.  ?Genitourinary:  Negative for bladder incontinence, difficulty urinating, dysuria, frequency, hematuria and nocturia.   ?Musculoskeletal:  Negative for arthralgias, back pain, flank pain, gait problem and myalgias.  ?Skin:  Negative for itching and rash.  ?Neurological:  Negative for dizziness, gait problem, headaches, light-headedness, numbness and seizures.  ?Hematological:  Negative for adenopathy. Does not bruise/bleed easily.  ?Psychiatric/Behavioral:  Negative for confusion and decreased concentration. The patient is not nervous/anxious.   ? ?MEDICAL HISTORY:  ?Past Medical History:  ?Diagnosis Date  ? Angioedema   ? BPH (benign prostatic hyperplasia)   ? Diverticulosis   ? HLD (hyperlipidemia)   ? HTN (hypertension)   ? ? ?SURGICAL HISTORY: ?Past Surgical History:  ?Procedure Laterality Date  ? HERNIA REPAIR    ? JOINT REPLACEMENT    ? ? ?SOCIAL HISTORY: ?Social History  ? ?Socioeconomic History  ? Marital status: Married  ?  Spouse name: Not on file  ? Number of children: Not on file  ? Years of education: Not on file  ? Highest education level: Not on file  ?Occupational History  ? Not on file  ?Tobacco Use  ? Smoking status: Former  ?  Years: 20.00  ?  Types: Cigarettes  ?  Quit date: 1997  ?  Years since quitting: 26.2  ? Smokeless tobacco: Never  ?Vaping Use  ? Vaping Use: Never used  ?Substance and Sexual Activity  ? Alcohol use: No  ? Drug use: No  ? Sexual activity: Not on  file  ?Other Topics Concern  ? Not on file  ?Social History Narrative  ? Not on file  ? ?Social Determinants of Health  ? ?Financial Resource Strain: Not on file  ?Food Insecurity: Not on file  ?Transportation Needs: Not on file  ?Physical Activity: Not on file  ?Stress: Not on  file  ?Social Connections: Not on file  ?Intimate Partner Violence: Not on file  ? ? ?FAMILY HISTORY: ?Family History  ?Problem Relation Age of Onset  ? Breast cancer Sister   ? Colon cancer Brother   ? ? ?ALLERGIES:  has No Known Allergies. ? ?MEDICATIONS:  ?Current Outpatient Medications  ?Medication Sig Dispense Refill  ? allopurinol (ZYLOPRIM) 300 MG tablet Take 300 mg by mouth daily.    ? amLODipine (NORVASC) 5 MG tablet Take 5 mg by mouth daily.    ? aspirin EC 325 MG tablet Take 325 mg by mouth daily.    ? hydrochlorothiazide (HYDRODIURIL) 25 MG tablet Take 25 mg by mouth daily.    ? Multiple Vitamins-Minerals (MULTIVITAMIN WITH MINERALS) tablet Take 1 tablet by mouth daily.    ? potassium chloride (K-DUR,KLOR-CON) 10 MEQ tablet Take 10 mEq by mouth daily.    ? simvastatin (ZOCOR) 20 MG tablet Take 20 mg by mouth daily.    ? tamsulosin (FLOMAX) 0.4 MG CAPS capsule Take 0.4 mg by mouth daily.    ? calcium-vitamin Johnston (OSCAL WITH Johnston) 500-200 MG-UNIT tablet Take 1 tablet by mouth daily. (Patient not taking: Reported on 04/16/2021) 30 tablet 0  ? magnesium oxide (MAG-OX) 400 MG tablet Take 400 mg by mouth daily. (Patient not taking: Reported on 04/16/2021)    ? vitamin B-12 (CYANOCOBALAMIN) 1000 MCG tablet Take 1 tablet (1,000 mcg total) by mouth daily. (Patient not taking: Reported on 04/16/2021) 30 tablet 0  ? ?No current facility-administered medications for this visit.  ? ? ? ?PHYSICAL EXAMINATION: ?ECOG PERFORMANCE STATUS: 0 - Asymptomatic ?Vitals:  ? 04/16/21 0847  ?BP: (!) 159/60  ?Pulse: (!) 54  ?Resp: 20  ?Temp: 98.7 ?F (37.1 ?C)  ?SpO2: 100%  ? ?Filed Weights  ? 04/16/21 0847  ?Weight: 168 lb 6.4 oz (76.4 kg)  ? ? ?Physical Exam ?Constitutional:   ?   General: He is not in acute distress. ?HENT:  ?   Head: Normocephalic and atraumatic.  ?Eyes:  ?   General: No scleral icterus. ?Cardiovascular:  ?   Rate and Rhythm: Normal rate and regular rhythm.  ?   Heart sounds: Normal heart sounds.  ?Pulmonary:  ?    Effort: Pulmonary effort is normal. No respiratory distress.  ?   Breath sounds: No wheezing.  ?Abdominal:  ?   General: Bowel sounds are normal. There is no distension.  ?   Palpations: Abdomen is soft.  ?Musculoskeletal:     ?   General: No deformity. Normal range of motion.  ?   Cervical back: Normal range of motion and neck supple.  ?Skin: ?   General: Skin is warm and dry.  ?   Findings: No erythema or rash.  ?Neurological:  ?   Mental Status: He is alert and oriented to person, place, and time. Mental status is at baseline.  ?   Cranial Nerves: No cranial nerve deficit.  ?   Coordination: Coordination normal.  ?Psychiatric:     ?   Mood and Affect: Mood normal.  ? ? ?RADIOGRAPHIC STUDIES: ?I have personally reviewed the radiological images as listed and agreed with the findings  in the report.  ?No results found. ? ? ? ?LABORATORY DATA:  ?I have reviewed the data as listed ?Lab Results  ?Component Value Date  ? WBC 3.5 (L) 04/16/2021  ? HGB 12.5 (L) 04/16/2021  ? HCT 37.2 (L) 04/16/2021  ? MCV 91.9 04/16/2021  ? PLT 206 04/16/2021  ? ?Recent Labs  ?  09/03/20 ?1141 04/16/21 ?0807  ?NA 138 138  ?K 3.8 3.3*  ?CL 102 101  ?CO2 29 28  ?GLUCOSE 88 98  ?BUN 15 15  ?CREATININE 0.75 0.76  ?CALCIUM 8.5* 8.4*  ?GFRNONAA >60 >60  ?PROT 7.2 6.7  ?ALBUMIN 4.3 3.9  ?AST 23 23  ?ALT 18 17  ?ALKPHOS 107 99  ?BILITOT 0.5 0.6  ? ?Iron/TIBC/Ferritin/ %Sat ?No results found for: IRON, TIBC, FERRITIN, IRONPCTSAT  ? ?RADIOGRAPHIC STUDIES: ?I have personally reviewed the radiological images as listed and agreed with the findings in the report. ?No results found.  ? ? ?ASSESSMENT & PLAN:  ?1. Other neutropenia (HCC)   ? ?Chronic leukopenia & neutropenia- previous workup including peripheral flow cytometry, ldh, spep, and hiv, hepatitis have either been negative or unremarkable. Blood smear also did not reveal abnormal morphology. He is asymptomatic and clinically no lymphadenopathy. WBC 3.5 which is low but ANC is normal at 2.1.  Recommend continued surveillance.  ?Anemia- previously low b12 level. He reports taking oral b12 1000 mcg daily. Hemoglobin 12.5. Monitor.  ?Hypocalcemia- Ca 8.4. Stable. Continue oral calcium supplement. Add otc

## 2021-06-10 ENCOUNTER — Other Ambulatory Visit: Payer: Self-pay

## 2021-06-10 ENCOUNTER — Emergency Department
Admission: EM | Admit: 2021-06-10 | Discharge: 2021-06-10 | Disposition: A | Discharge status: Home / Self Care | Payer: Medicare Other | Attending: Emergency Medicine | Admitting: Emergency Medicine

## 2021-06-10 DIAGNOSIS — R1110 Vomiting, unspecified: Secondary | ICD-10-CM | POA: Insufficient documentation

## 2021-06-10 DIAGNOSIS — R001 Bradycardia, unspecified: Secondary | ICD-10-CM | POA: Insufficient documentation

## 2021-06-10 DIAGNOSIS — R42 Dizziness and giddiness: Secondary | ICD-10-CM | POA: Diagnosis not present

## 2021-06-10 DIAGNOSIS — I10 Essential (primary) hypertension: Secondary | ICD-10-CM | POA: Diagnosis not present

## 2021-06-10 LAB — COMPREHENSIVE METABOLIC PANEL
ALT: 19 U/L (ref 0–44)
AST: 24 U/L (ref 15–41)
Albumin: 3.9 g/dL (ref 3.5–5.0)
Alkaline Phosphatase: 101 U/L (ref 38–126)
Anion gap: 6 (ref 5–15)
BUN: 19 mg/dL (ref 8–23)
CO2: 26 mmol/L (ref 22–32)
Calcium: 8.2 mg/dL — ABNORMAL LOW (ref 8.9–10.3)
Chloride: 108 mmol/L (ref 98–111)
Creatinine, Ser: 0.89 mg/dL (ref 0.61–1.24)
GFR, Estimated: >60 mL/min (ref >60)
Glucose, Bld: 153 mg/dL — ABNORMAL HIGH (ref 70–99)
Potassium: 3.5 mmol/L (ref 3.5–5.1)
Sodium: 140 mmol/L (ref 135–145)
Total Bilirubin: 0.6 mg/dL (ref 0.3–1.2)
Total Protein: 7.1 g/dL (ref 6.5–8.1)

## 2021-06-10 LAB — CBC
HCT: 36.7 % — ABNORMAL LOW (ref 39.0–52.0)
Hemoglobin: 11.6 g/dL — ABNORMAL LOW (ref 13.0–17.0)
MCH: 29.2 pg (ref 26.0–34.0)
MCHC: 31.6 g/dL (ref 30.0–36.0)
MCV: 92.4 fL (ref 80.0–100.0)
Platelets: 213 10*3/uL (ref 150–400)
RBC: 3.97 MIL/uL — ABNORMAL LOW (ref 4.22–5.81)
RDW: 13.8 % (ref 11.5–15.5)
WBC: 4.2 10*3/uL (ref 4.0–10.5)
nRBC: 0 % (ref 0.0–0.2)

## 2021-06-10 LAB — TROPONIN I (HIGH SENSITIVITY): Troponin I (High Sensitivity): 8 ng/L (ref <18)

## 2021-06-10 NOTE — ED Provider Notes (Signed)
[facility] Provider Note    Event Date/Time   First MD Initiated Contact with Patient 06/10/21 [phone]     (approximate)   History   Chief Complaint Dizziness and Vomiting   HPI  Brett Johnston is a 88 y.o. male with past medical history of hypertension, hyperlipidemia, and BPH who presents to the ED complaining of dizziness.  Patient reports that he was feeling slightly dizzy when he went to bed last night, describes a sensation of the room spinning around him.  He then woke up around 2:00 this morning and attempted to walk to the bathroom.  He states that the dizziness got worse while he was walking and he had to vomit multiple times when he got to the bathroom.  He denies any associated abdominal pain, diarrhea, fever, or dysuria.  EMS was called and brought him to the ED, where he now states he feels much better.  He denies any associated vision changes, speech changes, numbness, or weakness.  He has never had similar symptoms in the past.     Physical Exam   Triage Vital Signs: ED Triage Vitals  Enc Vitals Group     BP 06/10/21 0303 (!) 143/77     Pulse Rate 06/10/21 0303 (!) 51     Resp 06/10/21 0303 16     Temp 06/10/21 0303 98 F (36.7 C)     Temp Source 06/10/21 0303 Oral     SpO2 06/10/21 0303 100 %     Weight 06/10/21 0304 169 lb (76.7 kg)     Height 06/10/21 0304 5\' 7"  (1.702 m)     Head Circumference --      Peak Flow --      Pain Score 06/10/21 0303 0     Pain Loc --      Pain Edu? --      Excl. in GC? --     Most recent vital signs: Vitals:   06/10/21 0751 06/10/21 0752  BP: 123/89 136/76  Pulse: 73 (!) 54  Resp: 16 16  Temp:    SpO2: 100% 100%    Constitutional: Alert and oriented. Eyes: Conjunctivae are normal. Head: Atraumatic. Nose: No congestion/rhinnorhea. Mouth/Throat: Mucous membranes are moist.  Cardiovascular: Normal rate, regular rhythm. Grossly normal heart sounds.  2+ radial pulses bilaterally. Respiratory:  Normal respiratory effort.  No retractions. Lungs CTAB. Gastrointestinal: Soft and nontender. No distention. Musculoskeletal: No lower extremity tenderness nor edema.  Neurologic:  Normal speech and language. No gross focal neurologic deficits are appreciated.    ED Results / Procedures / Treatments   Labs (all labs ordered are listed, but only abnormal results are displayed) Labs Reviewed  CBC - Abnormal; Notable for the following components:      Result Value   RBC 3.97 (*)    Hemoglobin 11.6 (*)    HCT 36.7 (*)    All other components within normal limits  COMPREHENSIVE METABOLIC PANEL - Abnormal; Notable for the following components:   Glucose, Bld 153 (*)    Calcium 8.2 (*)    All other components within normal limits  TROPONIN I (HIGH SENSITIVITY)  TROPONIN I (HIGH SENSITIVITY)     EKG  ED ECG REPORT I, 08/10/21, the attending physician, personally viewed and interpreted this ECG.   Date: 06/10/2021  EKG Time: 3:14  Rate: 55  Rhythm: sinus bradycardia  Axis: LAD  Intervals:none  ST&T Change: None  PROCEDURES:  Critical Care performed: No  Procedures  MEDICATIONS ORDERED IN ED: Medications - No data to display   IMPRESSION / MDM / ASSESSMENT AND PLAN / ED COURSE  I reviewed the triage vital signs and the nursing notes.                              88 y.o. male with past medical history of hypertension, hyperlipidemia, and BPH who presents to the ED complaining of dizziness and vomiting occurring while attempting to go to the bathroom around 2:00 this morning.  Patient's presentation is most consistent with acute presentation with potential threat to life or bodily function.  Differential diagnosis includes, but is not limited to, stroke, TIA, arrhythmia, ACS, electrolyte abnormality, dehydration.  Patient well-appearing and in no acute distress, vital signs are remarkable for bradycardia but otherwise reassuring.  EKG shows sinus bradycardia  with no ischemic changes, low suspicion that this is contributing to his symptoms as heart rates are currently varying from the 50s to the 60s and he has not had any lightheadedness or near syncope.  It does appear he had similar heart rates on recent visit to his PCPs office and he is not on any AV nodal blocking agents.  He has no focal neurologic deficits to suggest stroke and given association with walking, symptoms seem consistent with a peripheral vertigo.  Labs are reassuring with no significant anemia or leukocytosis, BMP without electrolyte abnormality or AKI, troponin within normal limits.  We will p.o. challenge and have patient ambulate, if he continues to feel well he would be appropriate for outpatient follow-up with his PCP.  Patient able to drink water and ambulate independently without difficulty.  He is appropriate for discharge home with PCP follow-up, patient counseled to return to the ED for new or worsening symptoms.  Patient agrees with plan.      FINAL CLINICAL IMPRESSION(S) / ED DIAGNOSES   Final diagnoses:  Dizziness     Rx / DC Orders   ED Discharge Orders     None        Note:  This document was prepared using Dragon voice recognition software and may include unintentional dictation errors.   Chesley Noon, MD 06/10/21 [phone]

## 2021-06-10 NOTE — ED Notes (Signed)
Pt denies N/V or dizziness at this time

## 2021-06-10 NOTE — ED Triage Notes (Signed)
Pt states onset of dizziness, vomiting that began this morning. Pt denies pain. Per ems pt with sinus arrhythmia on ekg. Pt denies known fever.

## 2021-06-10 NOTE — ED Notes (Signed)
Pt oob in room ambulating. Pt steady on feet but states that he does have a little dizziness. Pt states that he feels better than when he came in. Pt drank cup of water, no c/o nausea.

## 2021-10-18 ENCOUNTER — Inpatient Hospital Stay: Payer: Medicare Other | Attending: Oncology

## 2021-10-18 ENCOUNTER — Inpatient Hospital Stay: Payer: Medicare Other | Admitting: Oncology
